# Patient Record
Sex: Female | Born: 1965 | Race: Black or African American | Hispanic: No | Marital: Married | Smoking: Never smoker
Health system: Southern US, Community
[De-identification: ages and names within clinical notes are randomized; demographics above are authoritative.]

## PROBLEM LIST (undated history)

## (undated) DIAGNOSIS — N6320 Unspecified lump in the left breast, unspecified quadrant: Secondary | ICD-10-CM

## (undated) DIAGNOSIS — D649 Anemia, unspecified: Secondary | ICD-10-CM

## (undated) DIAGNOSIS — J45909 Unspecified asthma, uncomplicated: Secondary | ICD-10-CM

---

## 2003-03-14 ENCOUNTER — Other Ambulatory Visit: Payer: Self-pay

## 2004-02-28 ENCOUNTER — Ambulatory Visit: Payer: Self-pay | Admitting: General Surgery

## 2004-03-12 ENCOUNTER — Emergency Department: Payer: Self-pay | Admitting: Emergency Medicine

## 2004-03-16 ENCOUNTER — Ambulatory Visit: Payer: Self-pay

## 2005-10-23 ENCOUNTER — Emergency Department: Payer: Self-pay | Admitting: Emergency Medicine

## 2008-12-19 ENCOUNTER — Emergency Department: Payer: Self-pay | Admitting: Emergency Medicine

## 2011-03-27 ENCOUNTER — Observation Stay: Payer: Self-pay | Admitting: Internal Medicine

## 2011-04-12 ENCOUNTER — Ambulatory Visit: Payer: Self-pay | Admitting: Internal Medicine

## 2011-04-29 ENCOUNTER — Ambulatory Visit: Payer: Self-pay | Admitting: Internal Medicine

## 2014-11-04 ENCOUNTER — Encounter: Payer: Self-pay | Admitting: Emergency Medicine

## 2014-11-04 ENCOUNTER — Ambulatory Visit
Admission: EM | Admit: 2014-11-04 | Discharge: 2014-11-04 | Disposition: A | Discharge status: Home / Self Care | Payer: 59 | Attending: Family Medicine | Admitting: Family Medicine

## 2014-11-04 ENCOUNTER — Ambulatory Visit: Payer: 59

## 2014-11-04 DIAGNOSIS — S8392XA Sprain of unspecified site of left knee, initial encounter: Secondary | ICD-10-CM | POA: Insufficient documentation

## 2014-11-04 DIAGNOSIS — S8992XA Unspecified injury of left lower leg, initial encounter: Secondary | ICD-10-CM

## 2014-11-04 DIAGNOSIS — W010XXA Fall on same level from slipping, tripping and stumbling without subsequent striking against object, initial encounter: Secondary | ICD-10-CM | POA: Insufficient documentation

## 2014-11-04 DIAGNOSIS — M25562 Pain in left knee: Secondary | ICD-10-CM | POA: Diagnosis present

## 2014-11-04 MED ORDER — IBUPROFEN 800 MG PO TABS: 800.0000 mg | ORAL_TABLET | Freq: Three times a day (TID) | ORAL | Status: DC

## 2014-11-04 MED ORDER — KETOROLAC TROMETHAMINE 60 MG/2ML IM SOLN
60.0000 mg | Freq: Once | INTRAMUSCULAR | Status: AC
  Administered 2014-11-04: 60 mg via INTRAMUSCULAR

## 2014-11-04 NOTE — ED Notes (Signed)
Was at [employer] for [employer]  and water was on the floor and she stepped in water and fell on left knee. Knee now swollen and painful.

## 2014-11-04 NOTE — ED Provider Notes (Addendum)
CSN: [id number]     Arrival date & time 11/04/14  1325 History   First MD Initiated Contact with Patient 11/04/14 1404     Chief Complaint  Patient presents with  . Knee Pain    left   (Consider location/radiation/quality/duration/timing/severity/associated sxs/prior Treatment) HPI 49 year old female presents for evaluation of the left knee. She sustained an injury yesterday while dancing when she slipped on a wet area on the floor causing her to do the splits and her left knee to twist. She has tried bearing weight, but is unable to do so due to pain. She complains of instability, but denies any catching or locking. She points to the anterior medial and lateral aspects of the knee as the areas of pain. No other injuries reported. She denies any numbness or tingling distally.  History reviewed. No pertinent past medical history. Past Surgical History  Procedure Laterality Date  . No past surgeries     No family history on file. History  Substance Use Topics  . Smoking status: Never Smoker   . Smokeless tobacco: Not on file  . Alcohol Use: No   OB History    No data available     Review of Systems A complete 12 system review was performed and all are negative with the exception of what is documented in the history of present illness.   Allergies  Contrast media; Shellfish allergy; and Strawberry  Home Medications   Prior to Admission medications   Not on File   BP 117/75 mmHg  Pulse 81  Temp(Src) 99.1 F (37.3 C) (Tympanic)  Resp 14  Ht  (1.575 m)  Wt 172 lb (78.019 kg)  BMI 31.45 kg/m2  SpO2 100%  LMP 10/29/2014 Physical Exam  Gen.: Well-developed well-nourished, no acute distress. Skin: Warm, dry, intact. Behavioral: Alert and oriented 3, answers questions appropriately Neuro: Neurovascularly intact distally. Normal sensation to light touch. Musculoskeletal: Right knee exam is unremarkable. Left knee exam: Moderate effusion. Tender medial and lateral joint  lines. Range of motion equals 0-90, limited by pain. Unable to perform McMurray's test, due to lack of motion. Negative Lockman's. Negative valgus stress test. Positive varus stress test.   ED Course  Procedures (including critical care time) Labs Review Labs Reviewed - No data to display  Imaging Review Dg Knee Complete 4 Views Left  11/04/2014   CLINICAL DATA:  49 year old female who slipped and fell onto her knee today. Pain with movement and unable to bear weight. Initial encounter.  EXAM: LEFT KNEE - COMPLETE 4+ VIEW  COMPARISON:  None.  FINDINGS: Moderate suprapatellar joint effusion. Bone mineralization within normal limits. Patella intact. Joint spaces preserved. No acute fracture or dislocation identified.  IMPRESSION: Moderate joint effusion. No acute fracture or dislocation identified about the left knee.   Electronically Signed   By: Odessa Fleming M.D.   On: 11/04/2014 14:44     MDM   1. Left knee sprain, initial encounter    Plan: 1. Test/x-ray results and diagnosis reviewed with patient 2. rx as per orders; risks, benefits, potential side effects reviewed with patient 3. Obtain MRI scan to assess for ligamentous injury 4. F/u with Orthopedics after MRI to discuss results and further treatment options 5. Patient given work note for sit-down work only until she is able to be evaluated by orthopedics 6. She was given crutches and a knee immobilizer for ambulation  Carlean Purl, PA-C 11/04/14 1525  15:08 Medication Given TL  ketorolac (TORADOL) injection 60 mg -  Dose: 60 mg ; Route: Intramuscular ; Site: Left Upper Outer Quadrant ; Scheduled Time: 1500       Carlean Purl, PA-C 12/20/14 1831

## 2014-11-04 NOTE — Discharge Instructions (Signed)
Lateral Collateral Knee Ligament Sprain with Phase I Rehab The lateral collateral ligament (LCL) of the knee helps hold the knee joint in proper alignment and prevents the bones from shifting out of alignment (displacing) toward the outside (laterally). Injury to the knee may cause a tear in the LCL ligament (sprain). The LCL is the least common ligament of the knee to be injured. Sprains may heal on their own, but they often result in a loose joint. Sprains are classified into three categories. Grade 1 sprains cause pain, but the tendon is not lengthened. Grade 2 sprains include a lengthened ligament, due to the ligament being stretched or partially ruptured. With grade 2 sprains there is still function, although the function may be decreased. Grade 3 sprains involve a complete tear of the tendon or muscle, and function is usually impaired. SYMPTOMS   Pain and tenderness on the outer side of the knee.  A "pop," tearing, or pulling sensation at the time of injury.  Bruising (contusion) at the site of injury within 48 hours of injury.  Knee stiffness.  Limping, often walking with the knee bent. CAUSES  An LCL sprain occurs when a force is placed on the ligament that is greater than it can handle. Common causes of injury include:  Direct hit (trauma) to the inner side of the knee, especially if the foot is planted on the ground.  Forceful pivoting of the body and leg while the foot is planted on the ground. RISK INCREASES WITH:  Contact sports (football, rugby).  Sports that require pivoting or cutting (soccer).  Poor knee strength and flexibility.  Improper equipment use. PREVENTION   Warm up and stretch properly before activity.  Maintain physical fitness:  Strength, flexibility, and endurance.  Cardiovascular fitness.  Wear properly fitted protective equipment (correct length of cleats for surface).  Functional braces may be effective in preventing injury. PROGNOSIS  If  treated properly, LCL tears usually heal on their own. Sometimes, surgery is required. RELATED COMPLICATIONS   Frequently recurring symptoms, such as knee giving way, instability, and swelling.  Injury to other structures in the knee joint.  Meniscal cartilage, resulting in locking and swelling of the knee.  Articular cartilage, resulting in knee arthritis.  Other ligaments of the knee (commonly).  Injury to nerves, causing numbness of the outer leg, foot, and ankle and weakness or paralysis, with inability to raise the ankle, big toe, or lesser toes.  Knee stiffness (loss of knee motion). TREATMENT  Treatment first involves the use of ice and medicine to reduce pain and inflammation. The use of strengthening and stretching exercises may help reduce pain with activity. These exercises may be performed at home, but referral to a therapist is often advised. You may be advised to walk with crutches until you are able to walk without a limp. Your caregiver may provide you with a hinged knee brace to help regain a full range of motion while also protecting the injured knee. For severe LCL injuries, or injuries that involve other ligaments of the knee, surgery is often advised. MEDICATION   If pain medicine is needed, nonsteroidal anti-inflammatory medicines (aspirin and ibuprofen), or other minor pain relievers (acetaminophen), are often advised.  Do not take pain medicine for 7 days before surgery.  Prescription pain relievers may be given, if your caregiver thinks they are needed. Use only as directed and only as much as you need. HEAT AND COLD  Cold treatment (icing) should be applied for 10 to 15 minutes  every 2 to 3 hours for inflammation and pain, and immediately after activity that aggravates your symptoms. Use ice packs or an ice massage.  Heat treatment may be used before performing stretching and strengthening activities prescribed by your caregiver, physical therapist, or athletic  trainer. Use a heat pack or a warm water soak. SEEK MEDICAL CARE IF:   Symptoms get worse or do not improve in 4 to 6 weeks, despite treatment.  New, unexplained symptoms develop. (Drugs used in treatment may produce side effects.) EXERCISES RANGE OF MOTION (ROM) AND STRETCHING EXERCISES - Lateral Collateral Knee Ligament Sprain Phase I These are some of the initial exercises that your physician, physical therapist or athletic trainer may have you perform to begin your rehabilitation. When you demonstrate gains in your flexibility and strength, your caregiver may progress you to Phase II exercises. As you perform these exercises, remember:   These initial exercises are intended to be gentle. They will help you restore motion without increasing any swelling.  Completing these exercises allows less painful movement and prepares you for the more aggressive strengthening exercises in Phase II.  An effective stretch should be held for at least 30 seconds.  A stretch should never be painful. You should only feel a gentle lengthening or release in the stretched tissue. RANGE OF MOTION - Knee Flexion, Active  Lie on your back with both knees straight. (If this causes back discomfort, bend your opposite knee, placing your foot flat on the floor.)  Slowly slide your heel back toward your buttocks until you feel a gentle stretch in the front of your knee or thigh.  Hold for ____5______ seconds. Slowly slide your heel back to the starting position. Repeat ____30_____ times. Complete this exercise _____2_____ times per day.  STRETCH - Knee Flexion, Supine  Lie on the floor with your right / left heel and foot lightly touching the wall. (Place both feet on the wall, if you do not use a door frame.)  Without using any effort, allow gravity to slide your foot down the wall slowly until you feel a gentle stretch in the front of your right / left knee.  Hold this stretch for _____5_____ seconds. Then  return the leg to the starting position, using your healthy leg for help, if needed. Repeat _____30_____ times. Complete this stretch _____2_____ times per day.  RANGE OF MOTION - Knee Flexion and Extension, Active-Assisted  Sit on the edge of a table or chair with your thighs firmly supported. It may be helpful to place a folded towel under the end of your right / left thigh.  Flexion (bending): Place the ankle of your healthy leg on top of the other ankle. Use your healthy leg to gently bend your right / left knee until you feel a mild tension across the top of your knee.  Hold for ____30______ seconds.  Extension (straightening): Switch your ankles so your right / left leg is on top. Use your healthy leg to straighten your right / left knee until you feel a mild tension on the backside of your knee.  Hold for _____30_____ seconds. Repeat ______3____ times. Complete this exercise ____2______ times per day. STRETCH - Knee Extension Sitting  Sit with your right / left leg/heel propped on another chair, coffee table, or foot stool.  Allow your leg muscles to relax, letting gravity straighten out your knee.*  You should feel a stretch behind your right / left knee. Hold this position for ____30______ seconds. Repeat ____3______ times.  Complete this stretch ____2______ times per day.  *Your physician, physical therapist, or athletic trainer may instruct you place a __________ weight on your thigh, just above your kneecap, to deepen the stretch.  STRENGTHENING EXERCISES Lateral Collateral Knee Ligament Sprain - Phase I These exercises may help you when beginning to rehabilitate your injury. They may resolve your symptoms with or without further involvement from your physician, physical therapist, or athletic trainer. While completing these exercises, remember:   Muscles can gain both the endurance and the strength needed for everyday activities through controlled exercises.  Complete these  exercises as instructed by your physician, physical therapist or athletic trainer. Increase the resistance and repetitions only as guided.  In order to return to more demanding activities, you will likely need to progress to more challenging exercises. Your physician, physical therapist or athletic trainer will advance your exercises when your tissues show adequate healing and your muscles demonstrate increased strength. STRENGTH - Quadriceps, Isometrics  Lie on your back with your right / left leg extended and your opposite knee bent.  Gradually tense the muscles in the front of your right / left thigh. You should see either your kneecap slide up toward your hip or increased dimpling just above the knee. This motion will push the back of the knee down toward the floor, mat, or bed on which you are lying.  Hold the muscle as tight as you can without increasing your pain for ___10_______ seconds.  Relax the muscles slowly and completely between each repetition. Repeat _____30_____ times. Complete this exercise ____2______ times per day.  STRENGTH - Quadriceps, Short Arcs   Lie on your back. Place a ____3______ inch towel roll under your right / left knee, so that the knee bends slightly.  Raise only your lower leg by tightening the muscles in the front of your thigh. Do not allow your thigh to rise.  Hold this position for ____10______ seconds. Repeat _____30_____ times. Complete this exercise ____2______ times per day.  OPTIONAL ANKLE WEIGHTS: Begin with ____________________, but DO NOT exceed ____________________. Increase in 1 pound/0.5 kilogram increments. STRENGTH - Quadriceps, Straight Leg Raises  Quality counts! Watch for signs that the quadriceps muscle is working, to be sure you are strengthening the correct muscles and not "cheating" by substituting with healthier muscles.  Lie on your back with your right / left leg extended and your opposite knee bent.  Tense the muscles in the  front of your right / left thigh. You should see either your kneecap slide up or increased dimpling just above the knee. Your thigh may even shake a bit.  Tighten these muscles even more and raise your leg 4 to 6 inches off the floor. Hold for __5________ seconds.  Keeping these muscles tense, lower your leg.  Relax the muscles slowly and completely in between each repetition. Repeat ____30______ times. Complete this exercise ___2_______ times per day.  STRENGTH - Hamstring, Isometrics   Lie on your back, on a firm surface.  Bend your right / left knee approximately ___45_______ degrees.  Dig your heel into the surface as if you are trying to pull it toward your buttocks. Tighten the muscles in the back of your thighs to "dig" as hard as you can, without increasing any pain.  Hold this position for ______5____ seconds.  Release the tension gradually and allow your muscle to completely relax for ____10______ seconds in between each exercise. Repeat _____30_____ times. Complete this exercise ____2______ times per day.  STRENGTH - Hamstring,  Curls   Lie on your stomach with your legs extended. (If you lie on a bed, your feet may hang over the edge.)  Tighten the muscles in the back of your thigh to bend your right / left knee up to 90 degrees. Keep your hips flat on the bed.  Hold this position for ___3_______ seconds.  Slowly lower your leg back to the starting position. Repeat ______30____ times. Complete this exercise ____2______ times per day.  OPTIONAL ANKLE WEIGHTS: Begin with ____________________, but DO NOT exceed ____________________. Increase in 1 pound/0.5 kilogram increments. Document Released: 04/29/2005 Document Revised: 09/13/2013 Document Reviewed: 08/11/2008 [facility] Patient Information 2015 [place], [place]. This information is not intended to replace advice given to you by your health care provider. Make sure you discuss any questions you have with your health care  provider.

## 2014-11-04 NOTE — ED Notes (Signed)
MRI schedule for 10/3014 at 7:00am at the [facility]patient location.  Patient was informed of this.

## 2014-11-10 ENCOUNTER — Ambulatory Visit: Admit: 2014-11-10 | Payer: 59

## 2014-11-19 ENCOUNTER — Ambulatory Visit
Admission: RE | Admit: 2014-11-19 | Discharge: 2014-11-19 | Disposition: A | Discharge status: Home / Self Care | Payer: 59 | Source: Ambulatory Visit | Attending: Physician Assistant | Admitting: Physician Assistant

## 2014-11-19 DIAGNOSIS — S82142A Displaced bicondylar fracture of left tibia, initial encounter for closed fracture: Secondary | ICD-10-CM | POA: Insufficient documentation

## 2014-11-19 DIAGNOSIS — M23322 Other meniscus derangements, posterior horn of medial meniscus, left knee: Secondary | ICD-10-CM | POA: Insufficient documentation

## 2014-11-19 DIAGNOSIS — M25462 Effusion, left knee: Secondary | ICD-10-CM | POA: Diagnosis not present

## 2014-11-19 DIAGNOSIS — M238X2 Other internal derangements of left knee: Secondary | ICD-10-CM | POA: Insufficient documentation

## 2014-11-19 DIAGNOSIS — M2352 Chronic instability of knee, left knee: Secondary | ICD-10-CM | POA: Diagnosis present

## 2014-11-19 DIAGNOSIS — M25562 Pain in left knee: Secondary | ICD-10-CM | POA: Diagnosis present

## 2015-05-04 ENCOUNTER — Other Ambulatory Visit: Payer: Self-pay | Admitting: Adult Health

## 2015-05-04 DIAGNOSIS — Z1239 Encounter for other screening for malignant neoplasm of breast: Secondary | ICD-10-CM

## 2015-05-18 ENCOUNTER — Ambulatory Visit
Admission: RE | Admit: 2015-05-18 | Discharge: 2015-05-18 | Disposition: A | Discharge status: Home / Self Care | Payer: 59 | Source: Ambulatory Visit | Attending: Physician Assistant | Admitting: Physician Assistant

## 2015-05-18 ENCOUNTER — Ambulatory Visit: Payer: 59

## 2015-05-18 DIAGNOSIS — Z1231 Encounter for screening mammogram for malignant neoplasm of breast: Secondary | ICD-10-CM | POA: Diagnosis present

## 2015-05-18 DIAGNOSIS — Z1239 Encounter for other screening for malignant neoplasm of breast: Secondary | ICD-10-CM

## 2015-05-22 ENCOUNTER — Other Ambulatory Visit: Payer: Self-pay | Admitting: Adult Health

## 2015-05-22 DIAGNOSIS — R921 Mammographic calcification found on diagnostic imaging of breast: Secondary | ICD-10-CM

## 2015-05-29 ENCOUNTER — Other Ambulatory Visit: Payer: Self-pay | Admitting: Adult Health

## 2015-05-29 ENCOUNTER — Ambulatory Visit
Admission: RE | Admit: 2015-05-29 | Discharge: 2015-05-29 | Disposition: A | Discharge status: Home / Self Care | Payer: 59 | Source: Ambulatory Visit | Attending: Adult Health | Admitting: Adult Health

## 2015-05-29 DIAGNOSIS — R921 Mammographic calcification found on diagnostic imaging of breast: Secondary | ICD-10-CM | POA: Insufficient documentation

## 2015-05-30 ENCOUNTER — Other Ambulatory Visit: Payer: Self-pay | Admitting: Adult Health

## 2015-05-30 DIAGNOSIS — R920 Mammographic microcalcification found on diagnostic imaging of breast: Secondary | ICD-10-CM

## 2015-06-06 ENCOUNTER — Ambulatory Visit
Admission: RE | Admit: 2015-06-06 | Discharge: 2015-06-06 | Disposition: A | Discharge status: Home / Self Care | Payer: 59 | Source: Ambulatory Visit | Attending: Adult Health | Admitting: Adult Health

## 2015-06-06 DIAGNOSIS — R920 Mammographic microcalcification found on diagnostic imaging of breast: Secondary | ICD-10-CM

## 2015-06-06 DIAGNOSIS — N62 Hypertrophy of breast: Secondary | ICD-10-CM | POA: Diagnosis not present

## 2015-06-07 LAB — SURGICAL PATHOLOGY

## 2015-06-15 ENCOUNTER — Ambulatory Visit: Payer: Self-pay | Admitting: Surgery

## 2015-06-15 DIAGNOSIS — N6320 Unspecified lump in the left breast, unspecified quadrant: Secondary | ICD-10-CM

## 2015-06-15 NOTE — H&P (Signed)
Ashley Cherry 06/15/2015 1:35 PM Location: [place] Office Patient #: [id number] DOB: May 25, 1965 Undefined / Language: [place] / Race: Black or African American Female  History of Present Illness Ashley Fus A. Maury Groninger MD; 06/15/2015 3:20 PM) Patient words: Pt sent at the request of Dr Ashley Cherry for left breast mass/microcalcifications found on screening mammogram. No complaints of mass, discharge or change in her breasts. no family history of breast cancer. Pathology shows microcalcifications and radial scar.                    CLINICAL DATA: Patient recalled from screening for left breast calcifications. EXAM: DIGITAL DIAGNOSTIC LEFT MAMMOGRAM COMPARISON: Baseline mammographic evaluation 05/18/2015. ACR Breast Density Category c: The breast tissue is heterogeneously dense, which may obscure small masses. FINDINGS: Magnification CC and true lateral views demonstrate a 5 mm group of amorphous calcifications within the upper-outer left breast anterior depth. IMPRESSION: Suspicious left breast calcifications. RECOMMENDATION: Stereotactic guided core needle biopsy left breast calcifications. Biopsy will be scheduled at patient's convenience. I have discussed the findings and recommendations with the patient. Results were also provided in writing at the conclusion of the visit. If applicable, a reminder letter will be sent to the patient regarding the next appointment. BI-RADS CATEGORY 4: Suspicious. Electronically Signed By: Ashley Cherry M.D. On: 05/29/2015 16:16   Result History MM Digital Diagnostic Unilat L (Order #[id number]) on 05/29/2015 - Order Result History Report <[email or web address]>    Vitals Height Weight BMI (Calculated)   Interpretation Summary CLINICAL DATA: Patient recalled from screening for left breast calcifications. EXAM: DIGITAL DIAGNOSTIC LEFT MAMMOGRAM COMPARISON: Baseline mammographic evaluation 05/18/2015. ACR Breast  Density Category c: The breast tissue is heterogeneously dense, which may obscure small masses. FINDINGS: Magnification CC and true lateral views demonstrate a 5 mm group of amorphous calcifications within the upper-outer left breast anterior depth. IMPRESSION: Suspicious left breast calcifications. RECOMMENDATION: Stereotactic guided core needle biopsy left breast calcifications. Biopsy will be scheduled at patient's convenience. I have discussed the findings and recommendations with the patient. Results were also provided in writing at the conclusion of the visit. If applicable, a reminder letter will be sent to the patient regarding the next appointment. BI-RADS CATEGORY 4: Suspicious. Electronically Signed By: Ashley Cherry M.D. On: 05/29/2015 16:16    Surgical Pathology CASE: [id number] PATIENT: Ashley Cherry Surgical Pathology Report SPECIMEN SUBMITTED: A. Breast, left, upper outer quadrant CLINICAL HISTORY: Calcs on mammo PRE-OPERATIVE DIAGNOSIS: FCC VS DCIS POST-OPERATIVE DIAGNOSIS: None provided. DIAGNOSIS: A. LEFT BREAST, UPPER OUTER QUADRANT; CORE BIOPSY: - COLUMNAR CELL CHANGE WITH INTRALUMINAL MICROCALCIFICATIONS. - PSEUDO-ANGIOMATOUS STROMAL HYPERPLASIA. - FOCAL CHANGES CONSISTENT WITH RADIAL SCAR SPANNING 3 MM. - NEGATIVE FOR ATYPIA AND MALIGNANCY.  The patient is a 50 year old female.   Past Surgical History Ashley Cherry, [place]; 06/15/2015 1:38 PM) Breast Biopsy - Left  Allergies Ashley Cherry, [place]; 06/15/2015 1:59 PM) Contrast Media Ready-Box *MEDICAL DEVICES AND SUPPLIES* Shellfish-derived Products STRAWBERRY  Medication History Ashley Cherry, [place]; 06/15/2015 1:59 PM) No Current Medications Medications Reconciled  Social History Ashley Cherry, [place]; 06/15/2015 1:38 PM) Current tobacco use Never smoker. Alcohol use Occasional alcohol use.  Family History Ashley Cherry, [place]; 06/15/2015 1:38 PM) Alcohol  Abuse Father, Mother. High Blood Pressure / Hypertension Son, Sister.  Pregnancy / Birth History Ashley Cherry, [place]; 06/15/2015 1:37 PM) Pregnancies ([place]) 5. Deliveries ([place]) 5. Mammogram within the last year. Pap smear Regular periods     Physical Exam (Ashley Bialas A. Khaled Herda MD; 06/15/2015 3:17 PM)  General Mental  Status-Alert. General Appearance-Consistent with stated age. Hydration-Well hydrated. Voice-Normal.  Eye Eyeball - Bilateral-Extraocular movements intact. Sclera/Conjunctiva - Bilateral-No scleral icterus.  Chest and Lung Exam Chest and lung exam reveals -quiet, even and easy respiratory effort with no use of accessory muscles and on auscultation, normal breath sounds, no adventitious sounds and normal vocal resonance. Inspection Chest Wall - Normal. Back - normal.  Breast Note: left breast bruising noted no mass in left or right breast no nipple discharge bilaterally  Cardiovascular Cardiovascular examination reveals -normal heart sounds, regular rate and rhythm with no murmurs and normal pedal pulses bilaterally.  Neurologic Neurologic evaluation reveals -alert and oriented x 3 with no impairment of recent or remote memory. Mental Status-Normal.  Musculoskeletal Normal Exam - Left-Upper Extremity Strength Normal and Lower Extremity Strength Normal. Normal Exam - Right-Upper Extremity Strength Normal and Lower Extremity Strength Normal.  Lymphatic Note: mild left axillary adenopathy noted no right axillary adenopathy    Assessment & Plan (Ashley Hupfer A. Tierre Netto MD; 06/15/2015 3:19 PM)  MASS OF LEFT BREAST ON MAMMOGRAM (N63) Impression: microcalcifications noted and radial scar on core biopsy discussed low but possible risk of occult malignancy discussed observation pt opted for left breast seed localized lumpectomy Risk of lumpectomy include bleeding, infection, seroma, more surgery, use of seed/wire, wound care,  cosmetic deformity and the need for other treatment, blood clots, death. Pt agrees to proceed.  Current Plans Pt Education - [facility] Breast Biopsy HCI: discussed with patient and provided information. The anatomy and the physiology was discussed. The pathophysiology and natural history of the disease was discussed. Options were discussed and recommendations were made. Technique, risks, benefits, & alternatives were discussed. Risks such as stroke, heart attack, bleeding, indection, death, and other risks discussed. Questions answered. The patient agrees to proceed.

## 2015-06-27 ENCOUNTER — Other Ambulatory Visit: Payer: Self-pay | Admitting: Surgery

## 2015-06-27 DIAGNOSIS — N6320 Unspecified lump in the left breast, unspecified quadrant: Secondary | ICD-10-CM

## 2015-06-28 ENCOUNTER — Encounter (HOSPITAL_BASED_OUTPATIENT_CLINIC_OR_DEPARTMENT_OTHER): Payer: Self-pay | Admitting: *Deleted

## 2015-07-04 ENCOUNTER — Ambulatory Visit
Admission: RE | Admit: 2015-07-04 | Discharge: 2015-07-04 | Disposition: A | Discharge status: Home / Self Care | Payer: 59 | Source: Ambulatory Visit | Attending: Surgery | Admitting: Surgery

## 2015-07-04 DIAGNOSIS — N6320 Unspecified lump in the left breast, unspecified quadrant: Secondary | ICD-10-CM

## 2015-07-05 ENCOUNTER — Ambulatory Visit (HOSPITAL_BASED_OUTPATIENT_CLINIC_OR_DEPARTMENT_OTHER): Payer: 59 | Admitting: Anesthesiology

## 2015-07-05 ENCOUNTER — Ambulatory Visit
Admission: RE | Admit: 2015-07-05 | Discharge: 2015-07-05 | Disposition: A | Discharge status: Home / Self Care | Payer: 59 | Source: Ambulatory Visit | Attending: Surgery | Admitting: Surgery

## 2015-07-05 ENCOUNTER — Encounter (HOSPITAL_BASED_OUTPATIENT_CLINIC_OR_DEPARTMENT_OTHER): Payer: Self-pay | Admitting: *Deleted

## 2015-07-05 ENCOUNTER — Ambulatory Visit (HOSPITAL_BASED_OUTPATIENT_CLINIC_OR_DEPARTMENT_OTHER)
Admission: RE | Admit: 2015-07-05 | Discharge: 2015-07-05 | Disposition: A | Discharge status: Home / Self Care | Payer: 59 | Source: Ambulatory Visit | Attending: Surgery | Admitting: Surgery

## 2015-07-05 ENCOUNTER — Encounter (HOSPITAL_BASED_OUTPATIENT_CLINIC_OR_DEPARTMENT_OTHER)
Admission: RE | Disposition: A | Discharge status: Home / Self Care | Payer: Self-pay | Source: Ambulatory Visit | Attending: Surgery

## 2015-07-05 DIAGNOSIS — Z6829 Body mass index (BMI) 29.0-29.9, adult: Secondary | ICD-10-CM | POA: Insufficient documentation

## 2015-07-05 DIAGNOSIS — N6022 Fibroadenosis of left breast: Secondary | ICD-10-CM | POA: Diagnosis not present

## 2015-07-05 DIAGNOSIS — E669 Obesity, unspecified: Secondary | ICD-10-CM | POA: Insufficient documentation

## 2015-07-05 DIAGNOSIS — N6320 Unspecified lump in the left breast, unspecified quadrant: Secondary | ICD-10-CM

## 2015-07-05 DIAGNOSIS — N6489 Other specified disorders of breast: Secondary | ICD-10-CM | POA: Diagnosis present

## 2015-07-05 HISTORY — PX: BREAST LUMPECTOMY WITH RADIOACTIVE SEED LOCALIZATION: SHX6424

## 2015-07-05 HISTORY — DX: Anemia, unspecified: D64.9

## 2015-07-05 HISTORY — DX: Unspecified lump in the left breast, unspecified quadrant: N63.20

## 2015-07-05 SURGERY — BREAST LUMPECTOMY WITH RADIOACTIVE SEED LOCALIZATION
Anesthesia: General | Site: Breast | Laterality: Left

## 2015-07-05 MED ORDER — LACTATED RINGERS IV SOLN
INTRAVENOUS | Status: DC
  Administered 2015-07-05: 09:00:00 via INTRAVENOUS

## 2015-07-05 MED ORDER — ONDANSETRON HCL 4 MG/2ML IJ SOLN
INTRAMUSCULAR | Status: AC
  Filled 2015-07-05: qty 2

## 2015-07-05 MED ORDER — FENTANYL CITRATE (PF) 100 MCG/2ML IJ SOLN
25.0000 ug | INTRAMUSCULAR | Status: DC | PRN
  Administered 2015-07-05: 50 ug via INTRAVENOUS

## 2015-07-05 MED ORDER — ONDANSETRON HCL 4 MG/2ML IJ SOLN: 4.0000 mg | Freq: Once | INTRAMUSCULAR | Status: DC | PRN

## 2015-07-05 MED ORDER — CEFAZOLIN SODIUM-DEXTROSE 2-3 GM-% IV SOLR
INTRAVENOUS | Status: AC
  Filled 2015-07-05: qty 50

## 2015-07-05 MED ORDER — SUCCINYLCHOLINE CHLORIDE 20 MG/ML IJ SOLN
INTRAMUSCULAR | Status: AC
  Filled 2015-07-05: qty 1

## 2015-07-05 MED ORDER — FENTANYL CITRATE (PF) 100 MCG/2ML IJ SOLN
INTRAMUSCULAR | Status: AC
  Filled 2015-07-05: qty 2

## 2015-07-05 MED ORDER — DEXAMETHASONE SODIUM PHOSPHATE 10 MG/ML IJ SOLN
INTRAMUSCULAR | Status: AC
  Filled 2015-07-05: qty 1

## 2015-07-05 MED ORDER — GLYCOPYRROLATE 0.2 MG/ML IJ SOLN: 0.2000 mg | Freq: Once | INTRAMUSCULAR | Status: DC | PRN

## 2015-07-05 MED ORDER — PROPOFOL 10 MG/ML IV BOLUS
INTRAVENOUS | Status: AC
  Filled 2015-07-05: qty 20

## 2015-07-05 MED ORDER — LIDOCAINE HCL (CARDIAC) 20 MG/ML IV SOLN
INTRAVENOUS | Status: DC | PRN
  Administered 2015-07-05: 80 mg via INTRAVENOUS

## 2015-07-05 MED ORDER — FENTANYL CITRATE (PF) 100 MCG/2ML IJ SOLN
50.0000 ug | INTRAMUSCULAR | Status: DC | PRN
  Administered 2015-07-05: 100 ug via INTRAVENOUS

## 2015-07-05 MED ORDER — PROPOFOL 10 MG/ML IV BOLUS
INTRAVENOUS | Status: DC | PRN
  Administered 2015-07-05: 150 mg via INTRAVENOUS

## 2015-07-05 MED ORDER — BUPIVACAINE HCL (PF) 0.25 % IJ SOLN
INTRAMUSCULAR | Status: DC | PRN
  Administered 2015-07-05: 20 mL

## 2015-07-05 MED ORDER — TRAMADOL HCL 50 MG PO TABS: 50.0000 mg | ORAL_TABLET | Freq: Four times a day (QID) | ORAL | Status: DC | PRN

## 2015-07-05 MED ORDER — ONDANSETRON HCL 4 MG/2ML IJ SOLN
INTRAMUSCULAR | Status: DC | PRN
  Administered 2015-07-05: 4 mg via INTRAVENOUS

## 2015-07-05 MED ORDER — MIDAZOLAM HCL 2 MG/2ML IJ SOLN
1.0000 mg | INTRAMUSCULAR | Status: DC | PRN
Start: 2015-07-05 — End: 2015-07-05
  Administered 2015-07-05: 2 mg via INTRAVENOUS

## 2015-07-05 MED ORDER — SCOPOLAMINE 1 MG/3DAYS TD PT72: 1.0000 | MEDICATED_PATCH | Freq: Once | TRANSDERMAL | Status: DC | PRN

## 2015-07-05 MED ORDER — DEXTROSE 5 % IV SOLN
3.0000 g | INTRAVENOUS | Status: AC
  Administered 2015-07-05: 2 g via INTRAVENOUS

## 2015-07-05 MED ORDER — LIDOCAINE HCL (CARDIAC) 20 MG/ML IV SOLN
INTRAVENOUS | Status: AC
  Filled 2015-07-05: qty 5

## 2015-07-05 MED ORDER — DEXAMETHASONE SODIUM PHOSPHATE 4 MG/ML IJ SOLN
INTRAMUSCULAR | Status: DC | PRN
  Administered 2015-07-05: 10 mg via INTRAVENOUS

## 2015-07-05 MED ORDER — MIDAZOLAM HCL 2 MG/2ML IJ SOLN
INTRAMUSCULAR | Status: AC
  Filled 2015-07-05: qty 2

## 2015-07-05 MED ORDER — CHLORHEXIDINE GLUCONATE 4 % EX LIQD: 1.0000 "application " | Freq: Once | CUTANEOUS | Status: DC

## 2015-07-05 SURGICAL SUPPLY — 46 items
APPLIER CLIP 9.375 MED OPEN (MISCELLANEOUS)
BINDER BREAST LRG (GAUZE/BANDAGES/DRESSINGS) IMPLANT
BINDER BREAST MEDIUM (GAUZE/BANDAGES/DRESSINGS) IMPLANT
BINDER BREAST XLRG (GAUZE/BANDAGES/DRESSINGS) IMPLANT
BINDER BREAST XXLRG (GAUZE/BANDAGES/DRESSINGS) IMPLANT
BLADE SURG 15 STRL LF DISP TIS (BLADE) ×1 IMPLANT
BLADE SURG 15 STRL SS (BLADE) ×1
CANISTER SUC SOCK COL 7IN (MISCELLANEOUS) IMPLANT
CANISTER SUCT 1200ML W/VALVE (MISCELLANEOUS) IMPLANT
CHLORAPREP W/TINT 26ML (MISCELLANEOUS) ×2 IMPLANT
CLIP APPLIE 9.375 MED OPEN (MISCELLANEOUS) IMPLANT
COVER BACK TABLE 60X90IN (DRAPES) ×2 IMPLANT
COVER MAYO STAND STRL (DRAPES) ×2 IMPLANT
COVER PROBE W GEL 5X96 (DRAPES) ×2 IMPLANT
DECANTER SPIKE VIAL GLASS SM (MISCELLANEOUS) IMPLANT
DEVICE DUBIN W/COMP PLATE 8390 (MISCELLANEOUS) ×2 IMPLANT
DRAPE LAPAROTOMY 100X72 PEDS (DRAPES) ×2 IMPLANT
DRAPE UTILITY XL STRL (DRAPES) ×2 IMPLANT
ELECT COATED BLADE 2.86 ST (ELECTRODE) ×2 IMPLANT
ELECT REM PT RETURN 9FT ADLT (ELECTROSURGICAL) ×2
ELECTRODE REM PT RTRN 9FT ADLT (ELECTROSURGICAL) ×1 IMPLANT
GLOVE BIOGEL PI IND STRL 6.5 (GLOVE) ×1 IMPLANT
GLOVE BIOGEL PI IND STRL 7.0 (GLOVE) ×2 IMPLANT
GLOVE BIOGEL PI IND STRL 8 (GLOVE) ×1 IMPLANT
GLOVE BIOGEL PI INDICATOR 6.5 (GLOVE) ×1
GLOVE BIOGEL PI INDICATOR 7.0 (GLOVE) ×2
GLOVE BIOGEL PI INDICATOR 8 (GLOVE) ×1
GLOVE ECLIPSE 8.0 STRL XLNG CF (GLOVE) ×2 IMPLANT
GOWN STRL REUS W/ TWL LRG LVL3 (GOWN DISPOSABLE) ×2 IMPLANT
GOWN STRL REUS W/TWL LRG LVL3 (GOWN DISPOSABLE) ×2
HEMOSTAT SNOW SURGICEL 2X4 (HEMOSTASIS) IMPLANT
KIT MARKER MARGIN INK (KITS) ×2 IMPLANT
LIQUID BAND (GAUZE/BANDAGES/DRESSINGS) ×2 IMPLANT
NEEDLE HYPO 25X1 1.5 SAFETY (NEEDLE) ×2 IMPLANT
NS IRRIG 1000ML POUR BTL (IV SOLUTION) ×2 IMPLANT
PACK BASIN DAY SURGERY FS (CUSTOM PROCEDURE TRAY) ×2 IMPLANT
PENCIL BUTTON HOLSTER BLD 10FT (ELECTRODE) ×2 IMPLANT
SLEEVE SCD COMPRESS KNEE MED (MISCELLANEOUS) ×2 IMPLANT
SPONGE LAP 4X18 X RAY DECT (DISPOSABLE) ×2 IMPLANT
SUT MNCRL AB 4-0 PS2 18 (SUTURE) ×2 IMPLANT
SUT SILK 2 0 SH (SUTURE) IMPLANT
SUT VICRYL 3-0 CR8 SH (SUTURE) ×2 IMPLANT
SYR CONTROL 10ML LL (SYRINGE) ×2 IMPLANT
TOWEL OR 17X24 6PK STRL BLUE (TOWEL DISPOSABLE) ×2 IMPLANT
TUBE CONNECTING 20X1/4 (TUBING) IMPLANT
YANKAUER SUCT BULB TIP NO VENT (SUCTIONS) IMPLANT

## 2015-07-05 NOTE — Anesthesia Procedure Notes (Signed)
Procedure Name: LMA Insertion Date/Time: 07/05/2015 10:27 AM Performed by: Gar Gibbon Pre-anesthesia Checklist: Patient identified, Emergency Drugs available, Suction available and Patient being monitored Patient Re-evaluated:Patient Re-evaluated prior to inductionOxygen Delivery Method: Circle System Utilized Preoxygenation: Pre-oxygenation with 100% oxygen Intubation Type: IV induction Ventilation: Mask ventilation without difficulty LMA: LMA inserted LMA Size: 4.0 Number of attempts: 1 Airway Equipment and Method: Bite block Placement Confirmation: positive ETCO2 Tube secured with: Tape Dental Injury: Teeth and Oropharynx as per pre-operative assessment

## 2015-07-05 NOTE — Anesthesia Preprocedure Evaluation (Signed)
Anesthesia Evaluation  Patient identified by MRN, date of birth, ID band Patient awake    Reviewed: Allergy & Precautions, NPO status , Patient's Chart, lab work & pertinent test results  Airway Mallampati: II  TM Distance: >3 FB Neck ROM: Full    Dental no notable dental hx. (+) Dental Advisory Given   Pulmonary neg pulmonary ROS,    Pulmonary exam normal breath sounds clear to auscultation       Cardiovascular negative cardio ROS Normal cardiovascular exam Rhythm:Regular Rate:Normal     Neuro/Psych negative neurological ROS  negative psych ROS   GI/Hepatic negative GI ROS, Neg liver ROS,   Endo/Other  obesity  Renal/GU negative Renal ROS  negative genitourinary   Musculoskeletal negative musculoskeletal ROS (+)   Abdominal   Peds negative pediatric ROS (+)  Hematology negative hematology ROS (+)   Anesthesia Other Findings   Reproductive/Obstetrics negative OB ROS                             Anesthesia Physical Anesthesia Plan  ASA: II  Anesthesia Plan: General   Post-op Pain Management:    Induction: Intravenous  Airway Management Planned: LMA  Additional Equipment:   Intra-op Plan:   Post-operative Plan: Extubation in OR  Informed Consent: I have reviewed the patients History and Physical, chart, labs and discussed the procedure including the risks, benefits and alternatives for the proposed anesthesia with the patient or authorized representative who has indicated his/her understanding and acceptance.   Dental advisory given  Plan Discussed with: CRNA  Anesthesia Plan Comments:         Anesthesia Quick Evaluation

## 2015-07-05 NOTE — Interval H&P Note (Signed)
History and Physical Interval Note:  07/05/2015 10:09 AM  Ashley Cherry  has presented today for surgery, with the diagnosis of Left radial scar left breast mass   The various methods of treatment have been discussed with the patient and family. After consideration of risks, benefits and other options for treatment, the patient has consented to  Procedure(s): LEFT BREAST LUMPECTOMY WITH RADIOACTIVE SEED LOCALIZATION (Left) as a surgical intervention .  The patient's history has been reviewed, patient examined, no change in status, stable for surgery.  I have reviewed the patient's chart and labs.  Questions were answered to the patient's satisfaction.     Aldina Porta A.

## 2015-07-05 NOTE — Anesthesia Postprocedure Evaluation (Signed)
Anesthesia Post Note  Patient: Ashley Cherry  Procedure(s) Performed: Procedure(s) (LRB): LEFT BREAST LUMPECTOMY WITH RADIOACTIVE SEED LOCALIZATION (Left)  Patient location during evaluation: PACU Anesthesia Type: General Level of consciousness: awake and alert and patient cooperative Pain management: pain level controlled Vital Signs Assessment: post-procedure vital signs reviewed and stable Respiratory status: spontaneous breathing and respiratory function stable Cardiovascular status: stable Anesthetic complications: no    Last Vitals:  Filed Vitals:   07/05/15 1210 07/05/15 1245  BP:  106/66  Pulse: 66 75  Temp:  36.7 C  Resp: 18 16    Last Pain:  Filed Vitals:   07/05/15 1255  PainSc: 0-No pain                 Corrinna Karapetyan S

## 2015-07-05 NOTE — H&P (View-Only) (Signed)
Ashley Cherry 06/15/2015 1:35 PM Location: [place] Office Patient #: [id number] DOB: December 08, 1965 Undefined / Language: [place] / Race: Black or African American Female  History of Present Illness Ashley Fus A. Indio Santilli MD; 06/15/2015 3:20 PM) Patient words: Pt sent at the request of Dr Ashley Cherry for left breast mass/microcalcifications found on screening mammogram. No complaints of mass, discharge or change in her breasts. no family history of breast cancer. Pathology shows microcalcifications and radial scar.                    CLINICAL DATA: Patient recalled from screening for left breast calcifications. EXAM: DIGITAL DIAGNOSTIC LEFT MAMMOGRAM COMPARISON: Baseline mammographic evaluation 05/18/2015. ACR Breast Density Category c: The breast tissue is heterogeneously dense, which may obscure small masses. FINDINGS: Magnification CC and true lateral views demonstrate a 5 mm group of amorphous calcifications within the upper-outer left breast anterior depth. IMPRESSION: Suspicious left breast calcifications. RECOMMENDATION: Stereotactic guided core needle biopsy left breast calcifications. Biopsy will be scheduled at patient's convenience. I have discussed the findings and recommendations with the patient. Results were also provided in writing at the conclusion of the visit. If applicable, a reminder letter will be sent to the patient regarding the next appointment. BI-RADS CATEGORY 4: Suspicious. Electronically Signed By: Ashley Cherry M.D. On: 05/29/2015 16:16   Result History MM Digital Diagnostic Unilat L (Order #[id number]) on 05/29/2015 - Order Result History Report <[email or web address]>    Vitals Height Weight BMI (Calculated)   Interpretation Summary CLINICAL DATA: Patient recalled from screening for left breast calcifications. EXAM: DIGITAL DIAGNOSTIC LEFT MAMMOGRAM COMPARISON: Baseline mammographic evaluation 05/18/2015. ACR Breast  Density Category c: The breast tissue is heterogeneously dense, which may obscure small masses. FINDINGS: Magnification CC and true lateral views demonstrate a 5 mm group of amorphous calcifications within the upper-outer left breast anterior depth. IMPRESSION: Suspicious left breast calcifications. RECOMMENDATION: Stereotactic guided core needle biopsy left breast calcifications. Biopsy will be scheduled at patient's convenience. I have discussed the findings and recommendations with the patient. Results were also provided in writing at the conclusion of the visit. If applicable, a reminder letter will be sent to the patient regarding the next appointment. BI-RADS CATEGORY 4: Suspicious. Electronically Signed By: Ashley Cherry M.D. On: 05/29/2015 16:16    Surgical Pathology CASE: [id number] PATIENT: Ashley Cherry Surgical Pathology Report SPECIMEN SUBMITTED: A. Breast, left, upper outer quadrant CLINICAL HISTORY: Calcs on mammo PRE-OPERATIVE DIAGNOSIS: FCC VS DCIS POST-OPERATIVE DIAGNOSIS: None provided. DIAGNOSIS: A. LEFT BREAST, UPPER OUTER QUADRANT; CORE BIOPSY: - COLUMNAR CELL CHANGE WITH INTRALUMINAL MICROCALCIFICATIONS. - PSEUDO-ANGIOMATOUS STROMAL HYPERPLASIA. - FOCAL CHANGES CONSISTENT WITH RADIAL SCAR SPANNING 3 MM. - NEGATIVE FOR ATYPIA AND MALIGNANCY.  The patient is a 50 year old female.   Past Surgical History Ashley Cherry, [place]; 06/15/2015 1:38 PM) Breast Biopsy - Left  Allergies Ashley Cherry, [place]; 06/15/2015 1:59 PM) Contrast Media Ready-Box *MEDICAL DEVICES AND SUPPLIES* Shellfish-derived Products STRAWBERRY  Medication History Ashley Cherry, [place]; 06/15/2015 1:59 PM) No Current Medications Medications Reconciled  Social History Ashley Cherry, [place]; 06/15/2015 1:38 PM) Current tobacco use Never smoker. Alcohol use Occasional alcohol use.  Family History Ashley Cherry, [place]; 06/15/2015 1:38 PM) Alcohol  Abuse Father, Mother. High Blood Pressure / Hypertension Son, Sister.  Pregnancy / Birth History Ashley Cherry, [place]; 06/15/2015 1:37 PM) Pregnancies ([place]) 5. Deliveries ([place]) 5. Mammogram within the last year. Pap smear Regular periods     Physical Exam (Ashley Tolen A. Caleesi Kohl MD; 06/15/2015 3:17 PM)  General Mental  Status-Alert. General Appearance-Consistent with stated age. Hydration-Well hydrated. Voice-Normal.  Eye Eyeball - Bilateral-Extraocular movements intact. Sclera/Conjunctiva - Bilateral-No scleral icterus.  Chest and Lung Exam Chest and lung exam reveals -quiet, even and easy respiratory effort with no use of accessory muscles and on auscultation, normal breath sounds, no adventitious sounds and normal vocal resonance. Inspection Chest Wall - Normal. Back - normal.  Breast Note: left breast bruising noted no mass in left or right breast no nipple discharge bilaterally  Cardiovascular Cardiovascular examination reveals -normal heart sounds, regular rate and rhythm with no murmurs and normal pedal pulses bilaterally.  Neurologic Neurologic evaluation reveals -alert and oriented x 3 with no impairment of recent or remote memory. Mental Status-Normal.  Musculoskeletal Normal Exam - Left-Upper Extremity Strength Normal and Lower Extremity Strength Normal. Normal Exam - Right-Upper Extremity Strength Normal and Lower Extremity Strength Normal.  Lymphatic Note: mild left axillary adenopathy noted no right axillary adenopathy    Assessment & Plan (Ashley Linch A. Marissah Vandemark MD; 06/15/2015 3:19 PM)  MASS OF LEFT BREAST ON MAMMOGRAM (N63) Impression: microcalcifications noted and radial scar on core biopsy discussed low but possible risk of occult malignancy discussed observation pt opted for left breast seed localized lumpectomy Risk of lumpectomy include bleeding, infection, seroma, more surgery, use of seed/wire, wound care,  cosmetic deformity and the need for other treatment, blood clots, death. Pt agrees to proceed.  Current Plans Pt Education - [facility] Breast Biopsy HCI: discussed with patient and provided information. The anatomy and the physiology was discussed. The pathophysiology and natural history of the disease was discussed. Options were discussed and recommendations were made. Technique, risks, benefits, & alternatives were discussed. Risks such as stroke, heart attack, bleeding, indection, death, and other risks discussed. Questions answered. The patient agrees to proceed.

## 2015-07-05 NOTE — Transfer of Care (Signed)
Immediate Anesthesia Transfer of Care Note  Patient: Ashley Cherry  Procedure(s) Performed: Procedure(s): LEFT BREAST LUMPECTOMY WITH RADIOACTIVE SEED LOCALIZATION (Left)  Patient Location: PACU  Anesthesia Type:General  Level of Consciousness: awake, sedated and patient cooperative  Airway & Oxygen Therapy: Patient Spontanous Breathing and Patient connected to face mask oxygen  Post-op Assessment: Report given to RN and Post -op Vital signs reviewed and stable  Post vital signs: Reviewed and stable  Last Vitals:  Filed Vitals:   07/05/15 0903  BP: 95/65  Pulse: 74  Temp: 36.9 C  Resp: 16    Complications: No apparent anesthesia complications

## 2015-07-05 NOTE — Discharge Instructions (Signed)
[facility] °Office Phone Number [phone] ° °BREAST BIOPSY/ PARTIAL MASTECTOMY: POST OP INSTRUCTIONS ° °Always review your discharge instruction sheet given to you by the facility where your surgery was performed. ° °IF YOU HAVE DISABILITY OR FAMILY LEAVE FORMS, YOU MUST BRING THEM TO THE OFFICE FOR PROCESSING.  DO NOT GIVE THEM TO YOUR DOCTOR. ° °1. A prescription for pain medication may be given to you upon discharge.  Take your pain medication as prescribed, if needed.  If narcotic pain medicine is not needed, then you may take acetaminophen (Tylenol) or ibuprofen (Advil) as needed. °2. Take your usually prescribed medications unless otherwise directed °3. If you need a refill on your pain medication, please contact your pharmacy.  They will contact our office to request authorization.  Prescriptions will not be filled after 5pm or on week-ends. °4. You should eat very light the first 24 hours after surgery, such as soup, crackers, pudding, etc.  Resume your normal diet the day after surgery. °5. Most patients will experience some swelling and bruising in the breast.  Ice packs and a good support bra will help.  Swelling and bruising can take several days to resolve.  °6. It is common to experience some constipation if taking pain medication after surgery.  Increasing fluid intake and taking a stool softener will usually help or prevent this problem from occurring.  A mild laxative (Milk of Magnesia or Miralax) should be taken according to package directions if there are no bowel movements after 48 hours. °7. Unless discharge instructions indicate otherwise, you may remove your bandages 24-48 hours after surgery, and you may shower at that time.  You may have steri-strips (small skin tapes) in place directly over the incision.  These strips should be left on the skin for 7-10 days.  If your surgeon used skin glue on the incision, you may shower in 24 hours.  The glue will flake off over the  next 2-3 weeks.  Any sutures or staples will be removed at the office during your follow-up visit. °8. ACTIVITIES:  You may resume regular daily activities (gradually increasing) beginning the next day.  Wearing a good support bra or sports bra minimizes pain and swelling.  You may have sexual intercourse when it is comfortable. °a. You may drive when you no longer are taking prescription pain medication, you can comfortably wear a seatbelt, and you can safely maneuver your car and apply brakes. °b. RETURN TO WORK:  ______________________________________________________________________________________ °9. You should see your doctor in the office for a follow-up appointment approximately two weeks after your surgery.  Your doctor’s nurse will typically make your follow-up appointment when she calls you with your pathology report.  Expect your pathology report 2-3 business days after your surgery.  You may call to check if you do not hear from us after three days. °10. OTHER INSTRUCTIONS: _______________________________________________________________________________________________ _____________________________________________________________________________________________________________________________________ °_____________________________________________________________________________________________________________________________________ °_____________________________________________________________________________________________________________________________________ ° °WHEN TO CALL YOUR DOCTOR: °1. Fever over 101.0 °2. Nausea and/or vomiting. °3. Extreme swelling or bruising. °4. Continued bleeding from incision. °5. Increased pain, redness, or drainage from the incision. ° °The clinic staff is available to answer your questions during regular business hours.  Please don’t hesitate to call and ask to speak to one of the nurses for clinical concerns.  If you have a medical emergency, go to the nearest  emergency room or call 911.  A surgeon from [facility] is always on call at the hospital. ° °For further questions, please visit [email or web address]  ° ° ° °  Post Anesthesia Home Care Instructions ° °Activity: °Get plenty of rest for the remainder of the day. A responsible adult should stay with you for 24 hours following the procedure.  °For the next 24 hours, DO NOT: °-Drive a car °-Operate machinery °-Drink alcoholic beverages °-Take any medication unless instructed by your physician °-Make any legal decisions or sign important papers. ° °Meals: °Start with liquid foods such as gelatin or soup. Progress to regular foods as tolerated. Avoid greasy, spicy, heavy foods. If nausea and/or vomiting occur, drink only clear liquids until the nausea and/or vomiting subsides. Call your physician if vomiting continues. ° °Special Instructions/Symptoms: °Your throat may feel dry or sore from the anesthesia or the breathing tube placed in your throat during surgery. If this causes discomfort, gargle with warm salt water. The discomfort should disappear within 24 hours. ° °If you had a scopolamine patch placed behind your ear for the management of post- operative nausea and/or vomiting: ° °1. The medication in the patch is effective for 72 hours, after which it should be removed.  Wrap patch in a tissue and discard in the trash. Wash hands thoroughly with soap and water. °2. You may remove the patch earlier than 72 hours if you experience unpleasant side effects which may include dry mouth, dizziness or visual disturbances. °3. Avoid touching the patch. Wash your hands with soap and water after contact with the patch. °  ° °

## 2015-07-05 NOTE — Op Note (Signed)
Preoperative diagnosis: Left breast mass/ complex lesion    Postoperative diagnosis: Same   Procedure: Left breast seed localized lumpectomy  Surgeon: Harriette Bouillon M.D.  Anesthesia: Gen. With 0.25% Sensorcaine local  EBL: 20 cc  Specimen: Left breast tissue with clip and radioactive seed in the specimen. Verified with neoprobe and radiographic image showing both seed and clip in specimen  Indications for procedure: The patient presents for left breast lumpectomy after core biopsy showed complex lesion . Discussed the rationale for considering excision. Small risk of malignancy associated with  lesion after core biopsy. Discussed observation. Discussed wire localization / seed . Patient desired excision of left breast lesion.The procedure has been discussed with the patient. Alternatives to surgery have been discussed with the patient.  Risks of surgery include bleeding,  Infection,  Seroma formation, death, cosmetic deformity   and the need for further surgery.   The patient understands and wishes to proceed.   Description of procedure: Patient underwent seed placement as an outpatient. Patient presents today for left breast seed localized lumpectomy. Patient seen in  holding area. Questions are answered and neoprobe used to verify seed location. Patient taken back to the operating room and placed upon the OR table. After induction of general anesthesia, left breast prepped and draped in a sterile fashion. Timeout was done to verify proper procedure. Neoprobe used and hot spot identified and left breast upper-outer quadrant. This was marked with pen. Curvilinear incision made left upper outer quadrant breast. Dissection used with the help of a neoprobe around the tissue where the seed and clip were located. Tissue removed in its entirety with gross margins.. Neoprobe used and seed within specimen. Radiographs taken which show clip and seed  In specimen.     Hemostasis achieved and cavity closed  with 3-0 Vicryl and 4-0 Monocryl. Liquid adhesive applied. All final counts found to be correct. Specimen transported to pathology. Patient awoke extubated taken to recovery in satisfactory condition.

## 2015-07-06 ENCOUNTER — Encounter (HOSPITAL_BASED_OUTPATIENT_CLINIC_OR_DEPARTMENT_OTHER): Payer: Self-pay | Admitting: Surgery

## 2016-06-06 ENCOUNTER — Encounter: Payer: Self-pay | Admitting: Urgent Care

## 2016-06-06 ENCOUNTER — Emergency Department: Payer: 59

## 2016-06-06 ENCOUNTER — Emergency Department
Admission: EM | Admit: 2016-06-06 | Discharge: 2016-06-06 | Disposition: A | Discharge status: Home / Self Care | Payer: 59 | Attending: Emergency Medicine | Admitting: Emergency Medicine

## 2016-06-06 DIAGNOSIS — R69 Illness, unspecified: Secondary | ICD-10-CM

## 2016-06-06 DIAGNOSIS — J069 Acute upper respiratory infection, unspecified: Secondary | ICD-10-CM | POA: Insufficient documentation

## 2016-06-06 DIAGNOSIS — J111 Influenza due to unidentified influenza virus with other respiratory manifestations: Secondary | ICD-10-CM

## 2016-06-06 DIAGNOSIS — R509 Fever, unspecified: Secondary | ICD-10-CM | POA: Diagnosis present

## 2016-06-06 DIAGNOSIS — J45909 Unspecified asthma, uncomplicated: Secondary | ICD-10-CM | POA: Insufficient documentation

## 2016-06-06 HISTORY — DX: Unspecified asthma, uncomplicated: J45.909

## 2016-06-06 LAB — INFLUENZA PANEL BY PCR (TYPE A & B)
Influenza A By PCR: POSITIVE — AB
Influenza B By PCR: NEGATIVE

## 2016-06-06 MED ORDER — NAPROXEN 500 MG PO TABS: 500.0000 mg | ORAL_TABLET | Freq: Two times a day (BID) | ORAL | 0 refills | Status: DC

## 2016-06-06 MED ORDER — GUAIFENESIN-CODEINE 100-10 MG/5ML PO SOLN: 10.0000 mL | ORAL | 0 refills | Status: DC | PRN

## 2016-06-06 MED ORDER — OSELTAMIVIR PHOSPHATE 75 MG PO CAPS: 75.0000 mg | ORAL_CAPSULE | Freq: Two times a day (BID) | ORAL | 0 refills | Status: DC

## 2016-06-06 NOTE — ED Triage Notes (Addendum)
Patient presents with c/o, body aches, back pain, and fever (tmax 100.1) since Tuesday. (+) PO intake reported. (+) painful inspiration reported. PMH significant for asthma. 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

## 2016-06-06 NOTE — ED Provider Notes (Signed)
[facility] Emergency Department Provider Note   ____________________________________________   First MD Initiated Contact with Patient 06/06/16 2304     (approximate)  I have reviewed the triage vital signs and the nursing notes.   HISTORY  Chief Complaint Fever; Back Pain; and Cough    HPI Ashley Cherry is a 51 y.o. female presents for evaluation of body aches back pain and fever MAXIMUM TEMPERATURE of 100.1 since Tuesday which is 2 days ago. 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 Patient is okay denies any nausea vomiting or diarrhea.    Past Medical History:  Diagnosis Date  . Anemia   . Asthma   . Breast mass, left     There are no active problems to display for this patient.   Past Surgical History:  Procedure Laterality Date  . BREAST LUMPECTOMY WITH RADIOACTIVE SEED LOCALIZATION Left 07/05/2015   Procedure: LEFT BREAST LUMPECTOMY WITH RADIOACTIVE SEED LOCALIZATION;  Surgeon: Harriette Bouillon, MD;  Location: [facility];  Service: General;  Laterality: Left;    Prior to Admission medications   Medication Sig Start Date End Date Taking? Authorizing Provider  Iron-Vitamin C (VITRON-C) 65-125 MG TABS Take by mouth.    Historical Provider, MD  traMADol (ULTRAM) 50 MG tablet Take 1 tablet (50 mg total) by mouth every 6 (six) hours as needed. 07/05/15   Harriette Bouillon, MD    Allergies Contrast media [iodinated diagnostic agents]; Shellfish allergy; and Strawberry extract  No family history on file.  Social History Social History  Substance Use Topics  . Smoking status: Never Smoker  . Smokeless tobacco: Never Used  . Alcohol use No    Review of Systems Constitutional: No fever/chills Eyes: No visual changes. ENT: Positive for mild sore throat. Cardiovascular: Denies chest pain. Respiratory: Denies shortness of breath. As a for  coughing. Gastrointestinal: No abdominal pain.  No nausea, no vomiting.  No diarrhea.  No constipation. Genitourinary: Negative for dysuria. Musculoskeletal: Negative for back pain. As a for generalized body aches. Skin: Negative for rash. Neurological: Negative for headaches, focal weakness or numbness.  10-point ROS otherwise negative.  ____________________________________________   PHYSICAL EXAM:  VITAL SIGNS: ED Triage Vitals [06/06/16 2244]  Enc Vitals Group     BP 127/82     Pulse Rate 100     Resp 18     Temp 98.3 F (36.8 C)     Temp Source Oral     SpO2 98 %     Weight 172 lb (78 kg)     Height 5\' 3"  (1.6 m)     Head Circumference      Peak Flow      Pain Score 8     Pain Loc      Pain Edu?      Excl. in GC?     Constitutional: Alert and oriented. Well appearing and in no acute distress. Head: Atraumatic. Nose: No congestion/rhinnorhea. Mouth/Throat: Mucous membranes are moist.  Oropharynx non-erythematous. Neck: No stridor.   Cardiovascular: Normal rate, regular rhythm. Grossly normal heart sounds.  Good peripheral circulation. Respiratory: Normal respiratory effort.  No retractions. Lungs CTAB. Gastrointestinal: Soft and nontender. No distention. No abdominal bruits. No CVA tenderness. Musculoskeletal: No lower extremity tenderness nor edema.  No joint effusions. Neurologic:  Normal speech and language. No gross focal neurologic deficits are appreciated. No gait instability. Skin:  Skin is warm, dry and intact.  No rash noted. Psychiatric: Mood and affect are normal. Speech and behavior are normal.  ____________________________________________   LABS (all labs ordered are listed, but only abnormal results are displayed)  Labs Reviewed  INFLUENZA PANEL BY PCR (TYPE A & B)   ____________________________________________  EKG   ____________________________________________  RADIOLOGY  No acute cardiopulmonary  findings. ____________________________________________   PROCEDURES  Procedure(s) performed: None  Procedures  Critical Care performed: No  ____________________________________________   INITIAL IMPRESSION / ASSESSMENT AND PLAN / ED COURSE  Pertinent labs & imaging results that were available during my care of the patient were reviewed by me and considered in my medical decision making (see chart for details).  Acute URI with influenza-like symptoms. Rx given for 10-75 mg twice a day, Robitussin-AC, Naprosyn 500 mg twice a day. Patient follow-up with her PCP or return to ER with any worsening symptomology. She may return to work after fever free for 24 hours without the use of medication.      ____________________________________________   FINAL CLINICAL IMPRESSION(S) / ED DIAGNOSES  Final diagnoses:  None      NEW MEDICATIONS STARTED DURING THIS VISIT:  New Prescriptions   No medications on file     Note:  This document was prepared using Dragon voice recognition software and may include unintentional dictation errors.   Evangeline Dakinharles M Wyat Infinger, PA-C 06/06/16 [id number]    Phineas SemenGraydon Goodman, MD 06/07/16 [phone]

## 2016-06-07 ENCOUNTER — Telehealth: Payer: Self-pay | Admitting: Emergency Medicine

## 2016-06-07 NOTE — Telephone Encounter (Signed)
Called pateint and told her flu results.  She is taking the tamiflu.

## 2017-02-09 IMAGING — CR DG CHEST 2V
1 series · 2 of 2 positions shown · non-contrast
Comparison: Chest radiograph and CTA of the chest performed
03/27/2011

CLINICAL DATA: Acute onset of body aches, generalized back pain and
fever. Initial encounter.

EXAM:
CHEST  2 VIEW

[Series 1: w chest pa · 0.14mm/px · 2 of 2 slices shown]
[im 1/2]
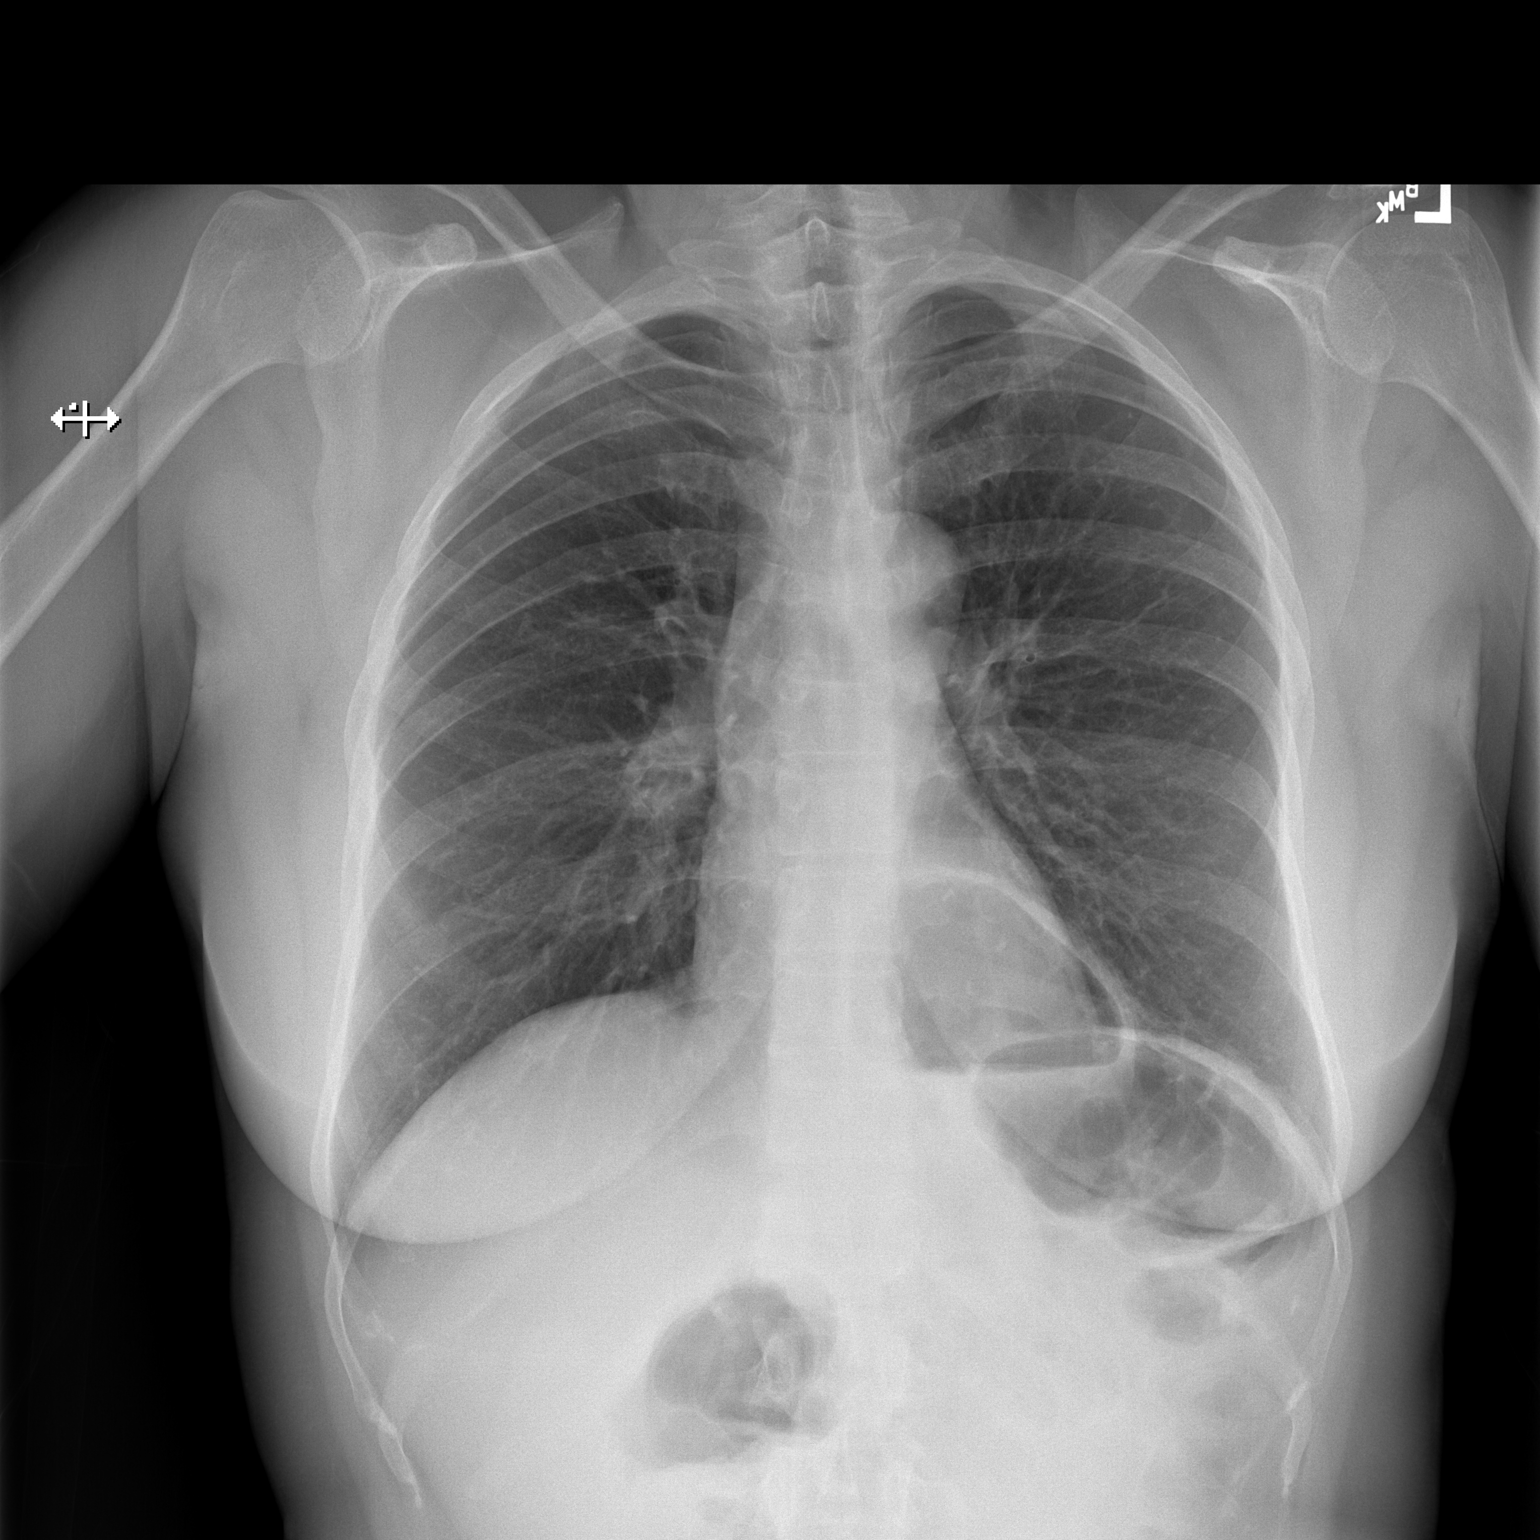
[im 2/2]
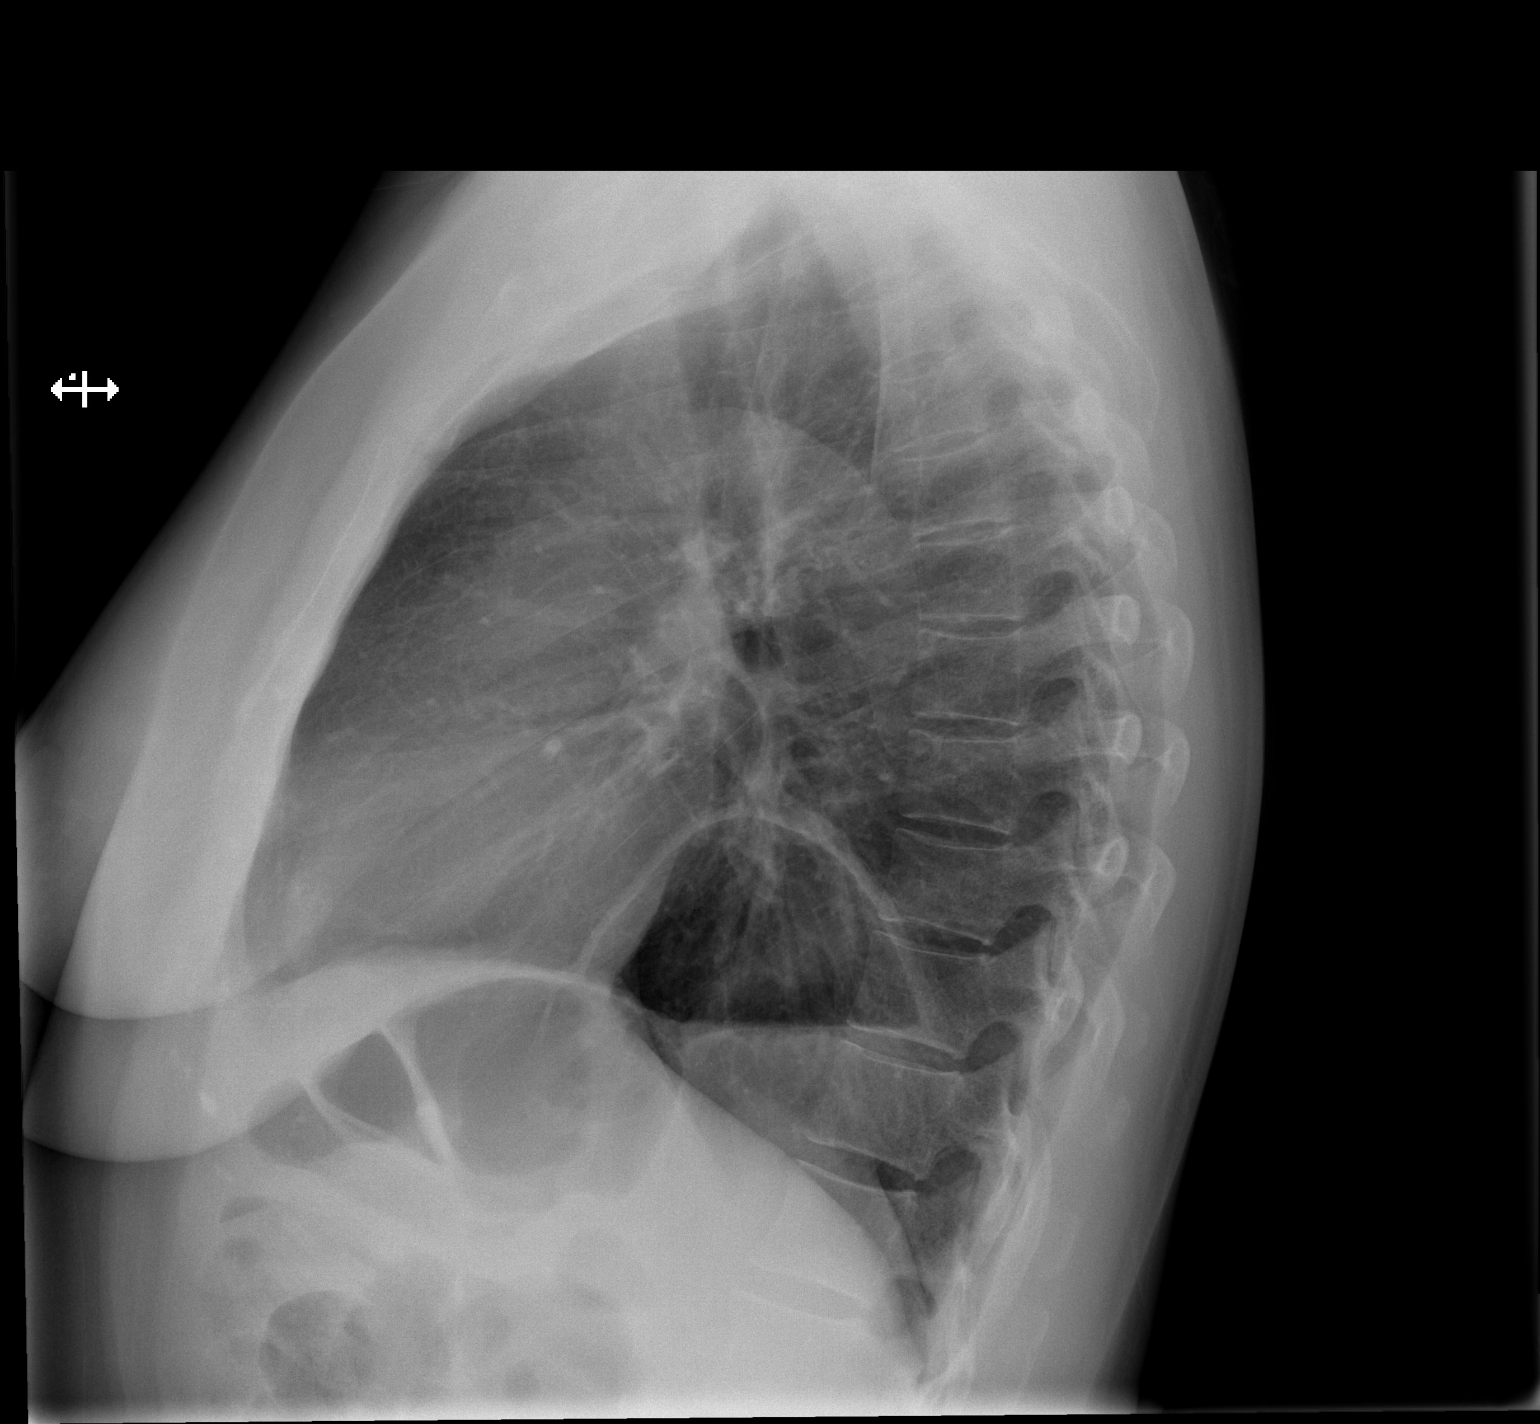

[2 of 2 positions shown; findings below may reference images not displayed]

FINDINGS: The lungs are well-aerated and clear. There is no evidence of focal
opacification, pleural effusion or pneumothorax.

The heart is normal in size; the mediastinal contour is within
normal limits. No acute osseous abnormalities are seen. A moderate
hiatal hernia is noted.
IMPRESSION: 1. No acute cardiopulmonary process seen.
2. Moderate hiatal hernia noted.

## 2018-01-23 DIAGNOSIS — H9190 Unspecified hearing loss, unspecified ear: Secondary | ICD-10-CM | POA: Diagnosis not present

## 2018-01-23 DIAGNOSIS — H93299 Other abnormal auditory perceptions, unspecified ear: Secondary | ICD-10-CM | POA: Diagnosis not present

## 2018-01-23 DIAGNOSIS — H9319 Tinnitus, unspecified ear: Secondary | ICD-10-CM | POA: Diagnosis not present

## 2018-01-28 DIAGNOSIS — Z0000 Encounter for general adult medical examination without abnormal findings: Secondary | ICD-10-CM | POA: Diagnosis not present

## 2018-01-28 DIAGNOSIS — Z1211 Encounter for screening for malignant neoplasm of colon: Secondary | ICD-10-CM | POA: Diagnosis not present

## 2018-01-28 DIAGNOSIS — Z1239 Encounter for other screening for malignant neoplasm of breast: Secondary | ICD-10-CM | POA: Diagnosis not present

## 2018-01-28 DIAGNOSIS — Z130 Encounter for screening for diseases of the blood and blood-forming organs and certain disorders involving the immune mechanism: Secondary | ICD-10-CM | POA: Diagnosis not present

## 2018-01-30 ENCOUNTER — Encounter: Payer: Self-pay | Admitting: Emergency Medicine

## 2018-01-30 ENCOUNTER — Other Ambulatory Visit: Payer: Self-pay

## 2018-01-30 ENCOUNTER — Emergency Department
Admission: EM | Admit: 2018-01-30 | Discharge: 2018-01-30 | Disposition: A | Discharge status: Home / Self Care | Payer: 59 | Attending: Emergency Medicine | Admitting: Emergency Medicine

## 2018-01-30 ENCOUNTER — Other Ambulatory Visit: Payer: Self-pay | Admitting: Student

## 2018-01-30 DIAGNOSIS — D649 Anemia, unspecified: Secondary | ICD-10-CM | POA: Insufficient documentation

## 2018-01-30 DIAGNOSIS — J45909 Unspecified asthma, uncomplicated: Secondary | ICD-10-CM | POA: Diagnosis not present

## 2018-01-30 DIAGNOSIS — Z1231 Encounter for screening mammogram for malignant neoplasm of breast: Secondary | ICD-10-CM

## 2018-01-30 DIAGNOSIS — Z79899 Other long term (current) drug therapy: Secondary | ICD-10-CM | POA: Diagnosis not present

## 2018-01-30 DIAGNOSIS — D509 Iron deficiency anemia, unspecified: Secondary | ICD-10-CM

## 2018-01-30 LAB — CBC
HEMATOCRIT: 25.7 % — AB (ref 35.0–47.0)
Hemoglobin: 7.6 g/dL — ABNORMAL LOW (ref 12.0–16.0)
MCH: 17.5 pg — AB (ref 26.0–34.0)
MCHC: 29.7 g/dL — ABNORMAL LOW (ref 32.0–36.0)
MCV: 58.7 fL — AB (ref 80.0–100.0)
Platelets: 501 10*3/uL — ABNORMAL HIGH (ref 150–440)
RBC: 4.38 MIL/uL (ref 3.80–5.20)
RDW: 20.2 % — AB (ref 11.5–14.5)
WBC: 11.1 10*3/uL — AB (ref 3.6–11.0)

## 2018-01-30 LAB — COMPREHENSIVE METABOLIC PANEL
ALBUMIN: 4.2 g/dL (ref 3.5–5.0)
ALT: 11 U/L (ref 0–44)
AST: 12 U/L — ABNORMAL LOW (ref 15–41)
Alkaline Phosphatase: 67 U/L (ref 38–126)
Anion gap: 8 (ref 5–15)
BUN: 14 mg/dL (ref 6–20)
CHLORIDE: 107 mmol/L (ref 98–111)
CO2: 23 mmol/L (ref 22–32)
CREATININE: 0.78 mg/dL (ref 0.44–1.00)
Calcium: 8.7 mg/dL — ABNORMAL LOW (ref 8.9–10.3)
GFR calc Af Amer: >60 mL/min (ref >60)
Glucose, Bld: 90 mg/dL (ref 70–99)
POTASSIUM: 3.7 mmol/L (ref 3.5–5.1)
SODIUM: 138 mmol/L (ref 135–145)
Total Bilirubin: 0.4 mg/dL (ref 0.3–1.2)
Total Protein: 7.8 g/dL (ref 6.5–8.1)

## 2018-01-30 LAB — TYPE AND SCREEN
ABO/RH(D): O POS
Antibody Screen: NEGATIVE

## 2018-01-30 NOTE — ED Notes (Signed)
Pt denies sx at this time. States she feels fine. Denies chest pain. Denies sob. Denies weakness.

## 2018-01-30 NOTE — ED Triage Notes (Signed)
[facility] with symptomatic [facility]. Pt states she has been feeling short of breath for "a couple of months." pt denies any pain. Pt's breathing unlabored and regular, pt in NAD. Pt states she had negative hemoccult, pt denies any known bleeding sources.

## 2018-01-30 NOTE — Discharge Instructions (Addendum)
Shortness of breath lightheadedness chest pain or you feel worse in any way or any bleeding or black stool, return to the emergency room.  Otherwise, please follow closely with the hematologist listed above, he will attempt to get you in on Monday or Tuesday, also, I think it would be in your best interest to follow-up with OB/GYN in the next few days as well if possible to see if you can get on the regimen to control your significant vaginal bleeding.

## 2018-01-30 NOTE — ED Triage Notes (Signed)
First Nurse Note:  Arrives for blood transfusion.  Patient seen [facility] and sent to ED for transfusion.  Patient had outpatient blood work done on Wednesday and HGB was 7.  Patient has had some SOB/ fatigue for the past 7 months.   Patient is AAOx3.  Skin warm and dry. NAD

## 2018-01-30 NOTE — ED Provider Notes (Signed)
[facility] Emergency Department Provider Note  ____________________________________________   I have reviewed the triage vital signs and the nursing notes. Where available I have reviewed prior notes and, if possible and indicated, outside hospital notes.    HISTORY  Chief Complaint Anemia    HPI Ashley Cherry is a 52 y.o. female  Who presents today complaining of being told to come here.  Patient has a history of anemia "lifelong", her last checked blood work was in 2016 which time her hemoglobin was around 8.3, she states that she has a history of very heavy menstrual periods, her last menstrual.  Was last week.  She also has asthma.  She states she was having trouble with asthma but no other shortness of breath and that is resolved with her nebulizer and that was several days ago.  States she does have very heavy.  She has not seen an OB for this.  Patient was called emergently by her provider when they noticed her hemoglobin at their routine checkup was 7.  They sent her in to the emergency room for a blood transfusion.  Patient was working as a Engineer, civil (consulting) and was surprised to find that she needed to go emergently for blood transition.  She states she is always anemic, she does not have any symptoms such as lightheadedness.  She denies any rectal bleeding impact she has had a negative guaiac test as an outpatient.  She has had no vaginal bleeding in over a week and she is not due to have more for several more weeks.  Her personal feeling is that she does not need to be here.     Past Medical History:  Diagnosis Date  . Anemia   . Asthma   . Breast mass, left     There are no active problems to display for this patient.   Past Surgical History:  Procedure Laterality Date  . BREAST LUMPECTOMY WITH RADIOACTIVE SEED LOCALIZATION Left 07/05/2015   Procedure: LEFT BREAST LUMPECTOMY WITH RADIOACTIVE SEED LOCALIZATION;  Surgeon: Harriette Bouillon, MD;  Location: [facility];  Service: General;  Laterality: Left;    Prior to Admission medications   Medication Sig Start Date End Date Taking? Authorizing Provider  guaiFENesin-codeine 100-10 MG/5ML syrup Take 10 mLs by mouth every 4 (four) hours as needed for cough. 06/06/16   Beers, Charmayne Sheer, PA-C  Iron-Vitamin C (VITRON-C) 65-125 MG TABS Take by mouth.    [provider]  naproxen (NAPROSYN) 500 MG tablet Take 1 tablet (500 mg total) by mouth 2 (two) times daily with a meal. 06/06/16   Beers, Charmayne Sheer, PA-C  oseltamivir (TAMIFLU) 75 MG capsule Take 1 capsule (75 mg total) by mouth 2 (two) times daily. 06/06/16   Beers, Charmayne Sheer, PA-C  traMADol (ULTRAM) 50 MG tablet Take 1 tablet (50 mg total) by mouth every 6 (six) hours as needed. 07/05/15   Harriette Bouillon, MD    Allergies Contrast media [iodinated diagnostic agents]; Shellfish allergy; and Strawberry extract  No family history on file.  Social History Social History   Tobacco Use  . Smoking status: Never Smoker  . Smokeless tobacco: Never Used  Substance Use Topics  . Alcohol use: No  . Drug use: No    Review of Systems Constitutional: No fever/chills Eyes: No visual changes. ENT: No sore throat. No stiff neck no neck pain Cardiovascular: Denies chest pain. Respiratory: Denies shortness of breath. Gastrointestinal:   no vomiting.  No diarrhea.  No  constipation. Genitourinary: Negative for dysuria. Musculoskeletal: Negative lower extremity swelling Skin: Negative for rash. Neurological: Negative for severe headaches, focal weakness or numbness.   ____________________________________________   PHYSICAL EXAM:  VITAL SIGNS: ED Triage Vitals  Enc Vitals Group     BP 01/30/18 1339 115/88     Pulse Rate 01/30/18 1339 76     Resp 01/30/18 1339 18     Temp 01/30/18 1339 98 F (36.7 C)     Temp Source 01/30/18 1339 Oral     SpO2 01/30/18 1339 99 %     Weight 01/30/18 1340 165 lb (74.8 kg)     Height 01/30/18  1340 5\' 3"  (1.6 m)     Head Circumference --      Peak Flow --      Pain Score 01/30/18 1340 0     Pain Loc --      Pain Edu? --      Excl. in GC? --     Constitutional: Alert and oriented. Well appearing and in no acute distress. Eyes: Conjunctivae are normal Head: Atraumatic HEENT: No congestion/rhinnorhea. Mucous membranes are moist.  Oropharynx non-erythematous Neck:   Nontender with no meningismus, no masses, no stridor Cardiovascular: Normal rate, regular rhythm. Grossly normal heart sounds.  Good peripheral circulation. Respiratory: Normal respiratory effort.  No retractions. Lungs CTAB. Abdominal: Soft and nontender. No distention. No guarding no rebound Back:  There is no focal tenderness or step off.  there is no midline tenderness there are no lesions noted. there is no CVA tenderness Exam: Guaiac negative brown stool female nurse chaperone present Musculoskeletal: No lower extremity tenderness, no upper extremity tenderness. No joint effusions, no DVT signs strong distal pulses no edema Neurologic:  Normal speech and language. No gross focal neurologic deficits are appreciated.  Skin:  Skin is warm, dry and intact. No rash noted. Psychiatric: Mood and affect are normal. Speech and behavior are normal.  ____________________________________________   LABS (all labs ordered are listed, but only abnormal results are displayed)  Labs Reviewed  COMPREHENSIVE METABOLIC PANEL - Abnormal; Notable for the following components:      Result Value   Calcium 8.7 (*)    AST 12 (*)    All other components within normal limits  CBC - Abnormal; Notable for the following components:   WBC 11.1 (*)    Hemoglobin 7.6 (*)    HCT 25.7 (*)    MCV 58.7 (*)    MCH 17.5 (*)    MCHC 29.7 (*)    RDW 20.2 (*)    Platelets 501 (*)    All other components within normal limits  TYPE AND SCREEN    Pertinent labs  results that were available during my care of the patient were reviewed by me  and considered in my medical decision making (see chart for details). ____________________________________________  EKG  I personally interpreted any EKGs ordered by me or triage  ____________________________________________  RADIOLOGY  Pertinent labs & imaging results that were available during my care of the patient were reviewed by me and considered in my medical decision making (see chart for details). If possible, patient and/or family made aware of any abnormal findings.  No results found. ____________________________________________    PROCEDURES  Procedure(s) performed: None  Procedures  Critical Care performed: None  ____________________________________________   INITIAL IMPRESSION / ASSESSMENT AND PLAN / ED COURSE  Pertinent labs & imaging results that were available during my care of the patient were reviewed by me  and considered in my medical decision making (see chart for details).  Patient here with anemia which to her mind is not symptomatic.  Sometime in the last 3 years her hemoglobin went from 8.3 down to 7.6.  This is likely secondary to menstrual blood; it is microcytic.  I did talk to Dr. Kizzie Banebrahamanday about this.  It is his preference that we not emergently transfuse this patient in the emergency room, is the patient's preference that we not emergently transfuse her in the emergency department.  He states that he can see about getting her in on Monday of next week.  He took her name and her information.  I think overall this is a better plan.  Patient is not symptomatically anemic, and she has normal blood pressure she is not actively bleeding I do not foresee much happening between now on Monday that would cause her troubles especially given the fact that this is likely been present for years although only recently re-noticed.  Patient is very much in concordance with this plan she is upset that have to be here at all and she is eager to go home.  Given the  patient's preference is to go home I do not think it is unsafe to do so and I have establish close outpatient follow-up for her we will discharge her.  Return questions and follow-up given and understood    ____________________________________________   FINAL CLINICAL IMPRESSION(S) / ED DIAGNOSES  Final diagnoses:  None      This chart was dictated using voice recognition software.  Despite best efforts to proofread,  errors can occur which can change meaning.      Jeanmarie PlantMcShane, Jacquelina Hewins A, MD 01/30/18 [phone]

## 2018-02-03 DIAGNOSIS — N920 Excessive and frequent menstruation with regular cycle: Secondary | ICD-10-CM | POA: Diagnosis not present

## 2018-02-03 DIAGNOSIS — D500 Iron deficiency anemia secondary to blood loss (chronic): Secondary | ICD-10-CM | POA: Diagnosis not present

## 2018-02-11 ENCOUNTER — Inpatient Hospital Stay: Payer: 59

## 2018-02-11 ENCOUNTER — Other Ambulatory Visit: Payer: Self-pay

## 2018-02-11 ENCOUNTER — Encounter: Payer: Self-pay | Admitting: Internal Medicine

## 2018-02-11 ENCOUNTER — Inpatient Hospital Stay: Payer: 59 | Attending: Internal Medicine | Admitting: Internal Medicine

## 2018-02-11 DIAGNOSIS — D509 Iron deficiency anemia, unspecified: Secondary | ICD-10-CM

## 2018-02-11 DIAGNOSIS — Z801 Family history of malignant neoplasm of trachea, bronchus and lung: Secondary | ICD-10-CM | POA: Diagnosis not present

## 2018-02-11 DIAGNOSIS — D500 Iron deficiency anemia secondary to blood loss (chronic): Secondary | ICD-10-CM

## 2018-02-11 DIAGNOSIS — N920 Excessive and frequent menstruation with regular cycle: Secondary | ICD-10-CM

## 2018-02-11 LAB — IRON AND TIBC
Iron: 15 ug/dL — ABNORMAL LOW (ref 28–170)
Saturation Ratios: 3 % — ABNORMAL LOW (ref 10.4–31.8)
TIBC: 480 ug/dL — ABNORMAL HIGH (ref 250–450)
UIBC: 465 ug/dL

## 2018-02-11 LAB — RETICULOCYTES
RBC.: 4.04 MIL/uL (ref 3.80–5.20)
RETIC COUNT ABSOLUTE: 72.7 10*3/uL (ref 19.0–183.0)
RETIC CT PCT: 1.8 % (ref 0.4–3.1)

## 2018-02-11 LAB — LACTATE DEHYDROGENASE: LDH: 99 U/L (ref 98–192)

## 2018-02-11 LAB — URINALYSIS, COMPLETE (UACMP) WITH MICROSCOPIC
Bilirubin Urine: NEGATIVE
GLUCOSE, UA: NEGATIVE mg/dL
HGB URINE DIPSTICK: NEGATIVE
Ketones, ur: NEGATIVE mg/dL
NITRITE: POSITIVE — AB
PROTEIN: NEGATIVE mg/dL
Specific Gravity, Urine: 1.017 (ref 1.005–1.030)
pH: 6 (ref 5.0–8.0)

## 2018-02-11 LAB — FERRITIN: Ferritin: 3 ng/mL — ABNORMAL LOW (ref 11–307)

## 2018-02-11 NOTE — Assessment & Plan Note (Addendum)
#   Severe Anemia- Hb 7.6; MCV- 57.  Patient symptomatic with worsening fatigue/pica.  Patient is intolerant to p.o. Iron.  Recommend IV iron Venofer weekly x4.   #The etiology of iron deficiency unclear-agree with EGD/colonoscopy as planned on 18 October.  Also check UA; Iron studies/ UA; ferritin; LDH. Labs today  # venofer x4 starting next week [authorization] ; Follow up with me in 6 weeks/cbc/possible venofer  Thank you for allowing me to participate in the care of your pleasant patient. Please do not hesitate to contact me with questions or concerns in the interim.

## 2018-02-11 NOTE — Progress Notes (Signed)
[facility] CONSULT NOTE  Patient Care Team: Carren Rang, PA-C as PCP - General (Physician Assistant)  CHIEF COMPLAINTS/PURPOSE OF CONSULTATION:  ANEMIA  # CHRONIC IDA ANEMIA [ "YEARS"] hb 7.6; MCV- 57 ? Menorraghia; EGD/[facility]- Oct 18th-penidng/Dr.T[place]edo.   No history exists.     HISTORY OF PRESENTING ILLNESS:  Ashley Cherry 52 y.o.  female with a history of "chronic anemia " has been referred to [place] for further evaluation and recommendations for symptomatic anemia.  Patient noted to have worsening as of breath especially with exertion over the last many months.  She also notes to have pica for ice.  Denies any blood in stools black or stools.  Denies any difficulty swallowing pain with swallowing.  Denies any significant weight loss.  Denies any blood in urine.  She does admit to heavy menstrual periods over the last 4 to 5 months.    Patient had labs in the PCP that showed a hemoglobin of around 7.  Patient was referred to the emergency room.  Hemoglobin in the emergency room was 7.6 MCV 56.  As patient was clinically stable no blood transfusion was given.  She was referred to hematology clinic for further evaluation recommendations.  Review of Systems  Constitutional: Negative for chills, diaphoresis, fever and malaise/fatigue.  HENT: Negative for nosebleeds and sore throat.   Eyes: Negative for double vision.  Respiratory: Positive for shortness of breath. Negative for cough, hemoptysis, sputum production and wheezing.   Cardiovascular: Negative for chest pain, palpitations, orthopnea and leg swelling.  Gastrointestinal: Negative for abdominal pain, blood in stool, constipation, diarrhea, heartburn, melena, nausea and vomiting.  Genitourinary: Negative for dysuria, frequency and urgency.  Musculoskeletal: Negative for back pain and joint pain.  Skin: Negative.  Negative for itching and rash.  Neurological: Negative for dizziness, tingling, focal weakness,  weakness and headaches.  Endo/Heme/Allergies: Does not bruise/bleed easily.  Psychiatric/Behavioral: Negative for depression. The patient is not nervous/anxious and does not have insomnia.      MEDICAL HISTORY:  Past Medical History:  Diagnosis Date  . Anemia   . Asthma   . Breast mass, left     SURGICAL HISTORY: Past Surgical History:  Procedure Laterality Date  . 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 Marital status: Married    Spouse name: Not on file  . Number of children: Not on file  . Years of education: Not on file  . Highest education level: Not on file  Occupational History  . Not on file  Social Needs  . Financial resource strain: Not on file  . Food insecurity:    Worry: Not on file    Inability: Not on file  . Transportation needs:    Medical: Not on file    Non-medical: Not on file  Tobacco Use  . Smoking status: Never Smoker  . Smokeless tobacco: Never Used  Substance and Sexual Activity  . Alcohol use: No  . Drug use: No  . Sexual activity: Yes  Lifestyle  . Physical activity:    Days per week: Not on file    Minutes per session: Not on file  . Stress: Not on file  Relationships  . Social connections:  Talks on phone: Not on file    Gets together: Not on file    Attends religious service: Not on file    Active member of club or organization: Not on file    Attends meetings of clubs or organizations: Not on file    Relationship status: Not on file  . Intimate partner violence:    Fear of current or ex partner: Not on file    Emotionally abused: Not on file    Physically abused: Not on file    Forced sexual activity: Not  on file  Other Topics Concern  . Not on file  Social History Narrative  . Not on file    FAMILY HISTORY: grandma- mat-lung cancer- non-smoker;  No family history on file.  ALLERGIES:  is allergic to contrast media [iodinated diagnostic agents]; shellfish allergy; and strawberry extract.  MEDICATIONS:  Current Outpatient Medications  Medication Sig Dispense Refill  . albuterol (PROVENTIL HFA;VENTOLIN HFA) 108 (90 Base) MCG/ACT inhaler Inhale into the lungs.     No current facility-administered medications for this visit.       Marland Kitchen  PHYSICAL EXAMINATION:   Vitals:   02/11/18 1449  BP: 119/74  Pulse: 87  Resp: 20  Temp: 98 F (36.7 C)   Filed Weights   02/11/18 1449  Weight: 167 lb (75.8 kg)    Physical Exam  Constitutional: She is oriented to person, place, and time.  HENT:  Head: Normocephalic and atraumatic.  Mouth/Throat: Oropharynx is clear and moist. No oropharyngeal exudate.  Eyes: Pupils are equal, round, and reactive to light.  Neck: Normal range of motion. Neck supple.  Cardiovascular: Normal rate and regular rhythm.  Pulmonary/Chest: No respiratory distress. She has no wheezes.  Abdominal: Soft. Bowel sounds are normal. She exhibits no distension and no mass. There is no tenderness. There is no rebound and no guarding.  Musculoskeletal: Normal range of motion. She exhibits no edema or tenderness.  Neurological: She is alert and oriented to person, place, and time.  Skin: Skin is warm. There is pallor.  Psychiatric: Affect normal.     LABORATORY DATA:  I have reviewed the data as listed Lab Results  Component Value Date   WBC 11.1 (H) 01/30/2018   HGB 7.6 (L) 01/30/2018   HCT 25.7 (L) 01/30/2018   MCV 58.7 (L) 01/30/2018   PLT 501 (H) 01/30/2018   Recent Labs    01/30/18 1341  NA 138  K 3.7  CL 107  CO2 23  GLUCOSE 90  BUN 14  CREATININE 0.78  CALCIUM 8.7*  GFRNONAA >60  GFRAA >60  PROT 7.8  ALBUMIN 4.2  AST 12*  ALT 11  ALKPHOS  67  BILITOT 0.4    RADIOGRAPHIC STUDIES: I have personally reviewed the radiological images as listed and agreed with the findings in the report. No results found.  ASSESSMENT & PLAN:   Iron deficiency anemia due to chronic blood loss # Severe Anemia- Hb 7.6; MCV- 57.  Patient symptomatic with worsening fatigue/pica.  Patient is intolerant to p.o. Iron.  Recommend IV iron Venofer weekly x4.   #The etiology of iron deficiency unclear-agree with EGD/colonoscopy as planned on 18 October.  Also check UA; Iron studies/ UA; ferritin; LDH. Labs today  # venofer x4 starting next week [authorization] ; Follow up with me in 6 weeks/cbc/possible venofer  Thank you for allowing me to participate in the care of your pleasant patient. Please do not hesitate to contact me with questions or concerns  in the interim.   All questions were answered. The patient knows to call the clinic with any problems, questions or concerns.    Earna Coder, MD 02/11/2018 3:18 PM

## 2018-02-12 ENCOUNTER — Other Ambulatory Visit: Payer: Self-pay | Admitting: Internal Medicine

## 2018-02-12 LAB — HAPTOGLOBIN: HAPTOGLOBIN: 206 mg/dL — AB (ref 34–200)

## 2018-02-16 ENCOUNTER — Inpatient Hospital Stay: Payer: 59

## 2018-02-16 VITALS — BP 108/64 | HR 83 | Temp 96.3°F | Resp 20

## 2018-02-16 DIAGNOSIS — D509 Iron deficiency anemia, unspecified: Secondary | ICD-10-CM | POA: Diagnosis not present

## 2018-02-16 DIAGNOSIS — D500 Iron deficiency anemia secondary to blood loss (chronic): Secondary | ICD-10-CM

## 2018-02-16 MED ORDER — SODIUM CHLORIDE 0.9 % IV SOLN: 200.0000 mg | Freq: Once | INTRAVENOUS | Status: DC

## 2018-02-16 MED ORDER — SODIUM CHLORIDE 0.9 % IV SOLN
Freq: Once | INTRAVENOUS | Status: AC
  Administered 2018-02-16: 15:00:00 via INTRAVENOUS
  Filled 2018-02-16: qty 250

## 2018-02-16 MED ORDER — IRON SUCROSE 20 MG/ML IV SOLN
200.0000 mg | Freq: Once | INTRAVENOUS | Status: AC
  Administered 2018-02-16: 200 mg via INTRAVENOUS
  Filled 2018-02-16: qty 10

## 2018-02-23 ENCOUNTER — Inpatient Hospital Stay: Payer: 59

## 2018-02-23 VITALS — BP 108/66 | HR 82

## 2018-02-23 DIAGNOSIS — D509 Iron deficiency anemia, unspecified: Secondary | ICD-10-CM | POA: Diagnosis not present

## 2018-02-23 DIAGNOSIS — D500 Iron deficiency anemia secondary to blood loss (chronic): Secondary | ICD-10-CM

## 2018-02-23 MED ORDER — SODIUM CHLORIDE 0.9 % IV SOLN
Freq: Once | INTRAVENOUS | Status: AC
  Administered 2018-02-23: 15:00:00 via INTRAVENOUS
  Filled 2018-02-23: qty 250

## 2018-02-23 MED ORDER — IRON SUCROSE 20 MG/ML IV SOLN
200.0000 mg | Freq: Once | INTRAVENOUS | Status: AC
  Administered 2018-02-23: 200 mg via INTRAVENOUS
  Filled 2018-02-23: qty 10

## 2018-02-26 DIAGNOSIS — H18623 Keratoconus, unstable, bilateral: Secondary | ICD-10-CM | POA: Diagnosis not present

## 2018-02-26 DIAGNOSIS — H52213 Irregular astigmatism, bilateral: Secondary | ICD-10-CM | POA: Diagnosis not present

## 2018-02-27 DIAGNOSIS — D509 Iron deficiency anemia, unspecified: Secondary | ICD-10-CM | POA: Diagnosis not present

## 2018-02-27 DIAGNOSIS — K449 Diaphragmatic hernia without obstruction or gangrene: Secondary | ICD-10-CM | POA: Diagnosis not present

## 2018-02-27 DIAGNOSIS — K2950 Unspecified chronic gastritis without bleeding: Secondary | ICD-10-CM | POA: Diagnosis not present

## 2018-02-27 DIAGNOSIS — K2970 Gastritis, unspecified, without bleeding: Secondary | ICD-10-CM | POA: Diagnosis not present

## 2018-02-27 DIAGNOSIS — K3189 Other diseases of stomach and duodenum: Secondary | ICD-10-CM | POA: Diagnosis not present

## 2018-03-02 ENCOUNTER — Inpatient Hospital Stay: Payer: 59

## 2018-03-02 VITALS — BP 105/69 | HR 76 | Resp 20

## 2018-03-02 DIAGNOSIS — D509 Iron deficiency anemia, unspecified: Secondary | ICD-10-CM | POA: Diagnosis not present

## 2018-03-02 DIAGNOSIS — D500 Iron deficiency anemia secondary to blood loss (chronic): Secondary | ICD-10-CM

## 2018-03-02 MED ORDER — IRON SUCROSE 20 MG/ML IV SOLN
200.0000 mg | Freq: Once | INTRAVENOUS | Status: AC
  Administered 2018-03-02: 200 mg via INTRAVENOUS
  Filled 2018-03-02: qty 10

## 2018-03-02 MED ORDER — SODIUM CHLORIDE 0.9 % IV SOLN
Freq: Once | INTRAVENOUS | Status: AC
  Administered 2018-03-02: 15:00:00 via INTRAVENOUS
  Filled 2018-03-02: qty 250

## 2018-03-09 ENCOUNTER — Inpatient Hospital Stay: Payer: 59

## 2018-03-09 VITALS — BP 109/72 | HR 87 | Temp 96.7°F | Resp 18

## 2018-03-09 DIAGNOSIS — D509 Iron deficiency anemia, unspecified: Secondary | ICD-10-CM | POA: Diagnosis not present

## 2018-03-09 DIAGNOSIS — D500 Iron deficiency anemia secondary to blood loss (chronic): Secondary | ICD-10-CM

## 2018-03-09 MED ORDER — SODIUM CHLORIDE 0.9 % IV SOLN
Freq: Once | INTRAVENOUS | Status: AC
  Administered 2018-03-09: 15:00:00 via INTRAVENOUS
  Filled 2018-03-09: qty 250

## 2018-03-09 MED ORDER — IRON SUCROSE 20 MG/ML IV SOLN
200.0000 mg | Freq: Once | INTRAVENOUS | Status: AC
  Administered 2018-03-09: 200 mg via INTRAVENOUS
  Filled 2018-03-09: qty 10

## 2018-03-20 ENCOUNTER — Other Ambulatory Visit: Payer: Self-pay | Admitting: Internal Medicine

## 2018-03-20 DIAGNOSIS — D500 Iron deficiency anemia secondary to blood loss (chronic): Secondary | ICD-10-CM

## 2018-03-25 ENCOUNTER — Inpatient Hospital Stay: Payer: 59 | Attending: Internal Medicine

## 2018-03-25 ENCOUNTER — Inpatient Hospital Stay: Payer: 59

## 2018-03-25 ENCOUNTER — Encounter: Payer: Self-pay | Admitting: Internal Medicine

## 2018-03-25 ENCOUNTER — Inpatient Hospital Stay (HOSPITAL_BASED_OUTPATIENT_CLINIC_OR_DEPARTMENT_OTHER): Payer: 59 | Admitting: Internal Medicine

## 2018-03-25 ENCOUNTER — Other Ambulatory Visit: Payer: Self-pay

## 2018-03-25 VITALS — BP 122/77 | HR 71 | Temp 99.1°F | Resp 12 | Ht 63.0 in | Wt 167.0 lb

## 2018-03-25 DIAGNOSIS — D500 Iron deficiency anemia secondary to blood loss (chronic): Secondary | ICD-10-CM

## 2018-03-25 DIAGNOSIS — N920 Excessive and frequent menstruation with regular cycle: Secondary | ICD-10-CM | POA: Diagnosis not present

## 2018-03-25 LAB — IRON AND TIBC
Iron: 28 ug/dL (ref 28–170)
Saturation Ratios: 7 % — ABNORMAL LOW (ref 10.4–31.8)
TIBC: 398 ug/dL (ref 250–450)
UIBC: 370 ug/dL

## 2018-03-25 LAB — CBC WITH DIFFERENTIAL/PLATELET
ABS IMMATURE GRANULOCYTES: 0.01 10*3/uL (ref 0.00–0.07)
BASOS PCT: 1 %
Basophils Absolute: 0.1 10*3/uL (ref 0.0–0.1)
Eosinophils Absolute: 0.4 10*3/uL (ref 0.0–0.5)
Eosinophils Relative: 5 %
HCT: 33.2 % — ABNORMAL LOW (ref 36.0–46.0)
Hemoglobin: 9.5 g/dL — ABNORMAL LOW (ref 12.0–15.0)
IMMATURE GRANULOCYTES: 0 %
Lymphocytes Relative: 28 %
Lymphs Abs: 1.9 10*3/uL (ref 0.7–4.0)
MCH: 21 pg — AB (ref 26.0–34.0)
MCHC: 28.6 g/dL — ABNORMAL LOW (ref 30.0–36.0)
MCV: 73.3 fL — ABNORMAL LOW (ref 80.0–100.0)
MONO ABS: 0.4 10*3/uL (ref 0.1–1.0)
Monocytes Relative: 6 %
NEUTROS ABS: 4 10*3/uL (ref 1.7–7.7)
NEUTROS PCT: 60 %
PLATELETS: 382 10*3/uL (ref 150–400)
RBC: 4.53 MIL/uL (ref 3.87–5.11)
RDW: 27.2 % — ABNORMAL HIGH (ref 11.5–15.5)
WBC: 6.7 10*3/uL (ref 4.0–10.5)
nRBC: 0 % (ref 0.0–0.2)

## 2018-03-25 LAB — COMPREHENSIVE METABOLIC PANEL
ALBUMIN: 4.2 g/dL (ref 3.5–5.0)
ALK PHOS: 63 U/L (ref 38–126)
ALT: 9 U/L (ref 0–44)
AST: 13 U/L — AB (ref 15–41)
Anion gap: 6 (ref 5–15)
BILIRUBIN TOTAL: 0.2 mg/dL — AB (ref 0.3–1.2)
BUN: 12 mg/dL (ref 6–20)
CALCIUM: 9.5 mg/dL (ref 8.9–10.3)
CO2: 24 mmol/L (ref 22–32)
Chloride: 105 mmol/L (ref 98–111)
Creatinine, Ser: 0.77 mg/dL (ref 0.44–1.00)
GFR calc Af Amer: >60 mL/min (ref >60)
GFR calc non Af Amer: >60 mL/min (ref >60)
GLUCOSE: 113 mg/dL — AB (ref 70–99)
POTASSIUM: 3.7 mmol/L (ref 3.5–5.1)
Sodium: 135 mmol/L (ref 135–145)
TOTAL PROTEIN: 7.7 g/dL (ref 6.5–8.1)

## 2018-03-25 LAB — FERRITIN: Ferritin: 28 ng/mL (ref 11–307)

## 2018-03-25 NOTE — Assessment & Plan Note (Addendum)
#   Severe Anemia- Hb 7.6; MCV- 57.  S/p  IV iron Venofer weekly x4; improved; Hb 9.3 today. Wants to go to work; re-schedule IV iron based on number from today.   # menorrhagia- ? improved  # DISPOSITION: # NO venofer today; will call based on iron studies. # Follow up in 3 months/cbc/possible Venofer-Dr.B

## 2018-03-25 NOTE — Progress Notes (Signed)
Patient here for follow up she reports that she feels "great", no PICA not tired.

## 2018-03-25 NOTE — Progress Notes (Signed)
[facility] CONSULT NOTE  Patient Care Team: Carren Rang, PA-C as PCP - General (Physician Assistant)  CHIEF COMPLAINTS/PURPOSE OF CONSULTATION: ANEMIA  # CHRONIC IDA ANEMIA [ "YEARS"] hb 7.6; MCV- 57 ? Menorraghia; EGD/[facility]- Oct 18th 2019-Normal; Dr.Toledo.    #   No history exists.     HISTORY OF PRESENTING ILLNESS:  Ashley Cherry 52 y.o.  female history of chronic iron deficiency anemia is here for follow-up.  Patient admits to mild to moderate shortness of breath with exertion.  Mild fatigue.  Denies any blood in stools black or stools.  Review of Systems  Constitutional: Positive for malaise/fatigue. Negative for chills, diaphoresis and fever.  HENT: Negative for nosebleeds and sore throat.   Eyes: Negative for double vision.  Respiratory: Positive for shortness of breath. Negative for cough, hemoptysis, sputum production and wheezing.   Cardiovascular: Negative for chest pain, palpitations, orthopnea and leg swelling.  Gastrointestinal: Negative for abdominal pain, blood in stool, constipation, diarrhea, heartburn, melena, nausea and vomiting.  Genitourinary: Negative for dysuria, frequency and urgency.  Musculoskeletal: Negative for back pain and joint pain.  Skin: Negative.  Negative for itching and rash.  Neurological: Negative for dizziness, tingling, focal weakness, weakness and headaches.  Endo/Heme/Allergies: Does not bruise/bleed easily.  Psychiatric/Behavioral: Negative for depression. The patient is not nervous/anxious and does not have insomnia.      MEDICAL HISTORY:  Past Medical History:  Diagnosis Date  . Anemia   . Asthma   . Breast mass, left     SURGICAL HISTORY: Past Surgical History:  Procedure Laterality Date  . BREAST LUMPECTOMY WITH RADIOACTIVE SEED LOCALIZATION Left 07/05/2015   Procedure: LEFT BREAST LUMPECTOMY WITH RADIOACTIVE SEED LOCALIZATION;  Surgeon: Harriette Bouillon, MD;  Location: [facility];   Service: General;  Laterality: Left;    SOCIAL HISTORY: works for [employer] care ; no smoking or alcohol  Social History   Socioeconomic History  . Marital status: Married    Spouse name: Not on file  . Number of children: Not on file  . Years of education: Not on file  . Highest education level: Not on file  Occupational History  . Not on file  Social Needs  . Financial resource strain: Not on file  . Food insecurity:    Worry: Not on file    Inability: Not on file  . Transportation needs:    Medical: Not on file    Non-medical: Not on file  Tobacco Use  . Smoking status: Never Smoker  . Smokeless tobacco: Never Used  Substance and Sexual Activity  . Alcohol use: No  . Drug use: No  . Sexual activity: Yes  Lifestyle  . Physical activity:    Days per week: Not on file    Minutes per session: Not on file  . Stress: Not on file  Relationships  . Social connections:    Talks on phone: Not on file    Gets together: Not on file    Attends religious service: Not on file    Active member of club or organization: Not on file    Attends meetings of clubs or organizations: Not on file    Relationship status: Not on file  . Intimate partner violence:    Fear of current or ex partner: Not on file    Emotionally abused: Not on file    Physically abused: Not on file    Forced sexual activity: Not on file  Other Topics Concern  .  Not on file  Social History Narrative  . Not on file    FAMILY HISTORY: grandma- mat-lung cancer- non-smoker;  History reviewed. No pertinent family history.  ALLERGIES:  is allergic to contrast media [iodinated diagnostic agents]; shellfish allergy; and strawberry extract.  MEDICATIONS:  Current Outpatient Medications  Medication Sig Dispense Refill  . albuterol (PROVENTIL HFA;VENTOLIN HFA) 108 (90 Base) MCG/ACT inhaler Inhale into the lungs.     No current facility-administered medications for this visit.       Marland Kitchen.  PHYSICAL  EXAMINATION:   Vitals:   03/25/18 1425  BP: 122/77  Pulse: 71  Resp: 12  Temp: 99.1 F (37.3 C)   Filed Weights   03/25/18 1425  Weight: 167 lb (75.8 kg)    Physical Exam  Constitutional: She is oriented to person, place, and time.  HENT:  Head: Normocephalic and atraumatic.  Mouth/Throat: Oropharynx is clear and moist. No oropharyngeal exudate.  Eyes: Pupils are equal, round, and reactive to light.  Neck: Normal range of motion. Neck supple.  Cardiovascular: Normal rate and regular rhythm.  Pulmonary/Chest: No respiratory distress. She has no wheezes.  Abdominal: Soft. Bowel sounds are normal. She exhibits no distension and no mass. There is no tenderness. There is no rebound and no guarding.  Musculoskeletal: Normal range of motion. She exhibits no edema or tenderness.  Neurological: She is alert and oriented to person, place, and time.  Skin: Skin is warm. There is pallor.  Psychiatric: Affect normal.     LABORATORY DATA:  I have reviewed the data as listed Lab Results  Component Value Date   WBC 6.7 03/25/2018   HGB 9.5 (L) 03/25/2018   HCT 33.2 (L) 03/25/2018   MCV 73.3 (L) 03/25/2018   PLT 382 03/25/2018   Recent Labs    01/30/18 1341 03/25/18 1349  NA 138 135  K 3.7 3.7  CL 107 105  CO2 23 24  GLUCOSE 90 113*  BUN 14 12  CREATININE 0.78 0.77  CALCIUM 8.7* 9.5  GFRNONAA >60 >60  GFRAA >60 >60  PROT 7.8 7.7  ALBUMIN 4.2 4.2  AST 12* 13*  ALT 11 9  ALKPHOS 67 63  BILITOT 0.4 0.2*    RADIOGRAPHIC STUDIES: I have personally reviewed the radiological images as listed and agreed with the findings in the report. No results found.  ASSESSMENT & PLAN:   Iron deficiency anemia due to chronic blood loss # Severe Anemia- Hb 7.6; MCV- 57.  S/p  IV iron Venofer weekly x4; improved; Hb 9.3 today. Wants to go to work; re-schedule IV iron based on number from today.   # menorrhagia- ? improved  # DISPOSITION: # NO venofer today; will call based on  iron studies. # Follow up in 3 months/cbc/possible Venofer-Dr.B     All questions were answered. The patient knows to call the clinic with any problems, questions or concerns.    Earna CoderGovinda R Jaikob Borgwardt, MD 04/14/2018 7:50 PM

## 2018-04-14 ENCOUNTER — Telehealth: Payer: Self-pay | Admitting: Internal Medicine

## 2018-04-14 NOTE — Telephone Encounter (Signed)
H/B-Please inform patient that her iron levels have improved but not normalized.  Recommend iron pill once a day over-the-counter; if having problems with p.o. iron then I would recommend IV iron.  Follow-up as planned.

## 2018-04-15 NOTE — Telephone Encounter (Signed)
Patient notified of these results and recommendations via voicemail. I advised patient to call back with any questions.

## 2018-07-01 ENCOUNTER — Other Ambulatory Visit: Payer: 59

## 2018-07-01 ENCOUNTER — Ambulatory Visit: Payer: 59

## 2018-07-01 ENCOUNTER — Ambulatory Visit: Payer: 59 | Admitting: Internal Medicine

## 2018-07-29 ENCOUNTER — Ambulatory Visit: Payer: 59 | Admitting: Internal Medicine

## 2018-07-29 ENCOUNTER — Ambulatory Visit: Payer: 59

## 2018-07-29 ENCOUNTER — Other Ambulatory Visit: Payer: 59

## 2018-08-21 ENCOUNTER — Encounter: Payer: Self-pay | Admitting: *Deleted

## 2018-08-26 ENCOUNTER — Inpatient Hospital Stay: Payer: 59

## 2018-08-26 ENCOUNTER — Inpatient Hospital Stay: Payer: 59 | Admitting: Internal Medicine

## 2018-10-22 ENCOUNTER — Telehealth: Payer: Self-pay | Admitting: *Deleted

## 2018-10-22 NOTE — Telephone Encounter (Signed)
Per patient request to cancel 10/27/18 lab/MD/Infusion. She stated that she would call back at a later date to R/S.

## 2018-10-27 ENCOUNTER — Ambulatory Visit: Payer: 59 | Admitting: Internal Medicine

## 2018-10-27 ENCOUNTER — Ambulatory Visit: Payer: 59

## 2018-10-27 ENCOUNTER — Other Ambulatory Visit: Payer: 59

## 2022-07-03 ENCOUNTER — Other Ambulatory Visit: Payer: Self-pay | Admitting: Obstetrics and Gynecology
